# Patient Record
Sex: Female | Born: 1970 | Race: Black or African American | Hispanic: No | Marital: Single | Smoking: Current every day smoker
Health system: Southern US, Community
[De-identification: ages and names within clinical notes are randomized; demographics above are authoritative.]

## PROBLEM LIST (undated history)

## (undated) HISTORY — PX: UTERINE FIBROID SURGERY: SHX826

## (undated) HISTORY — PX: ABDOMINAL HYSTERECTOMY: SHX81

---

## 2005-08-14 ENCOUNTER — Emergency Department (HOSPITAL_COMMUNITY): Admission: EM | Admit: 2005-08-14 | Discharge: 2005-08-14 | Payer: Self-pay | Admitting: Emergency Medicine

## 2009-08-16 ENCOUNTER — Emergency Department (HOSPITAL_BASED_OUTPATIENT_CLINIC_OR_DEPARTMENT_OTHER): Admission: EM | Admit: 2009-08-16 | Discharge: 2009-08-16 | Payer: Self-pay | Admitting: Emergency Medicine

## 2009-08-18 ENCOUNTER — Ambulatory Visit: Payer: Self-pay | Admitting: Radiology

## 2009-08-18 ENCOUNTER — Ambulatory Visit (HOSPITAL_BASED_OUTPATIENT_CLINIC_OR_DEPARTMENT_OTHER): Admission: RE | Admit: 2009-08-18 | Discharge: 2009-08-18 | Payer: Self-pay | Admitting: Emergency Medicine

## 2011-01-06 LAB — PREGNANCY, URINE: Preg Test, Ur: NEGATIVE

## 2011-01-06 LAB — COMPREHENSIVE METABOLIC PANEL
ALT: 17 U/L (ref 0–35)
AST: 39 U/L — ABNORMAL HIGH (ref 0–37)
Albumin: 4 g/dL (ref 3.5–5.2)
Alkaline Phosphatase: 85 U/L (ref 39–117)
GFR calc Af Amer: >60 mL/min (ref >60)
Potassium: 4.9 mEq/L (ref 3.5–5.1)
Sodium: 137 mEq/L (ref 135–145)
Total Protein: 7.8 g/dL (ref 6.0–8.3)

## 2011-01-06 LAB — URINE MICROSCOPIC-ADD ON

## 2011-01-06 LAB — CBC
Hemoglobin: 12.1 g/dL (ref 12.0–15.0)
Platelets: 200 10*3/uL (ref 150–400)
RDW: 16.4 % — ABNORMAL HIGH (ref 11.5–15.5)

## 2011-01-06 LAB — DIFFERENTIAL
Basophils Relative: 2 % — ABNORMAL HIGH (ref 0–1)
Eosinophils Absolute: 0.3 10*3/uL (ref 0.0–0.7)
Lymphs Abs: 2.3 10*3/uL (ref 0.7–4.0)
Monocytes Absolute: 0.5 10*3/uL (ref 0.1–1.0)
Monocytes Relative: 5 % (ref 3–12)

## 2011-01-06 LAB — URINALYSIS, ROUTINE W REFLEX MICROSCOPIC
Glucose, UA: NEGATIVE mg/dL
Hgb urine dipstick: NEGATIVE
Protein, ur: NEGATIVE mg/dL

## 2013-09-13 ENCOUNTER — Emergency Department (HOSPITAL_BASED_OUTPATIENT_CLINIC_OR_DEPARTMENT_OTHER)
Admission: EM | Admit: 2013-09-13 | Discharge: 2013-09-13 | Disposition: A | Discharge status: Home / Self Care | Payer: Self-pay | Attending: Emergency Medicine | Admitting: Emergency Medicine

## 2013-09-13 ENCOUNTER — Encounter (HOSPITAL_BASED_OUTPATIENT_CLINIC_OR_DEPARTMENT_OTHER): Payer: Self-pay | Admitting: Emergency Medicine

## 2013-09-13 DIAGNOSIS — R1110 Vomiting, unspecified: Secondary | ICD-10-CM | POA: Insufficient documentation

## 2013-09-13 DIAGNOSIS — R109 Unspecified abdominal pain: Secondary | ICD-10-CM

## 2013-09-13 DIAGNOSIS — R1033 Periumbilical pain: Secondary | ICD-10-CM | POA: Insufficient documentation

## 2013-09-13 DIAGNOSIS — F17200 Nicotine dependence, unspecified, uncomplicated: Secondary | ICD-10-CM | POA: Insufficient documentation

## 2013-09-13 DIAGNOSIS — Z90710 Acquired absence of both cervix and uterus: Secondary | ICD-10-CM | POA: Insufficient documentation

## 2013-09-13 LAB — CBC WITH DIFFERENTIAL/PLATELET
Basophils Absolute: 0 10*3/uL (ref 0.0–0.1)
Basophils Relative: 0 % (ref 0–1)
Eosinophils Absolute: 0.1 10*3/uL (ref 0.0–0.7)
Hemoglobin: 13.7 g/dL (ref 12.0–15.0)
MCH: 29.7 pg (ref 26.0–34.0)
MCHC: 32 g/dL (ref 30.0–36.0)
Monocytes Relative: 3 % (ref 3–12)
Neutro Abs: 10.8 10*3/uL — ABNORMAL HIGH (ref 1.7–7.7)
Neutrophils Relative %: 86 % — ABNORMAL HIGH (ref 43–77)
Platelets: 180 10*3/uL (ref 150–400)

## 2013-09-13 LAB — URINE MICROSCOPIC-ADD ON

## 2013-09-13 LAB — COMPREHENSIVE METABOLIC PANEL
ALT: 10 U/L (ref 0–35)
AST: 14 U/L (ref 0–37)
Albumin: 3.7 g/dL (ref 3.5–5.2)
Alkaline Phosphatase: 86 U/L (ref 39–117)
BUN: 15 mg/dL (ref 6–23)
Chloride: 102 mEq/L (ref 96–112)
Potassium: 3.9 mEq/L (ref 3.5–5.1)
Sodium: 138 mEq/L (ref 135–145)
Total Bilirubin: 0.3 mg/dL (ref 0.3–1.2)
Total Protein: 7.5 g/dL (ref 6.0–8.3)

## 2013-09-13 LAB — URINALYSIS, ROUTINE W REFLEX MICROSCOPIC
Glucose, UA: NEGATIVE mg/dL
Ketones, ur: NEGATIVE mg/dL
Protein, ur: NEGATIVE mg/dL
pH: 5.5 (ref 5.0–8.0)

## 2013-09-13 MED ORDER — TRAMADOL HCL 50 MG PO TABS: 50.0000 mg | ORAL_TABLET | Freq: Four times a day (QID) | ORAL | Status: DC | PRN

## 2013-09-13 MED ORDER — MORPHINE SULFATE 4 MG/ML IJ SOLN
4.0000 mg | Freq: Once | INTRAMUSCULAR | Status: AC
  Administered 2013-09-13: 4 mg via INTRAVENOUS
  Filled 2013-09-13: qty 1

## 2013-09-13 MED ORDER — SODIUM CHLORIDE 0.9 % IV SOLN
INTRAVENOUS | Status: DC
  Administered 2013-09-13: 23:00:00 via INTRAVENOUS

## 2013-09-13 MED ORDER — HYDROCODONE-ACETAMINOPHEN 5-325 MG PO TABS
2.0000 | ORAL_TABLET | Freq: Once | ORAL | Status: AC
  Administered 2013-09-13: 2 via ORAL
  Filled 2013-09-13: qty 2

## 2013-09-13 MED ORDER — PROMETHAZINE HCL 25 MG RE SUPP: 25.0000 mg | Freq: Four times a day (QID) | RECTAL | Status: DC | PRN

## 2013-09-13 MED ORDER — ONDANSETRON HCL 4 MG/2ML IJ SOLN
4.0000 mg | Freq: Once | INTRAMUSCULAR | Status: AC
  Administered 2013-09-13: 4 mg via INTRAVENOUS
  Filled 2013-09-13: qty 2

## 2013-09-13 NOTE — ED Provider Notes (Signed)
CSN: [id number]     Arrival date & time 09/13/13  2133 History  This chart was scribed for Gilda Crease, * by Carl Best, ED Scribe. This patient was seen in room MH05/MH05 and the patient's care was started at 9:48 PM.     Chief Complaint  Patient presents with  . Abdominal Pain    Patient is a 42 y.o. female presenting with abdominal pain. The history is provided by the patient. No language interpreter was used.  Abdominal Pain Associated symptoms: vomiting   Associated symptoms: no diarrhea and no nausea    HPI Comments: Jocelyn Adams is a 42 y.o. female who presents to the Emergency Department complaining of intermittent periumbilical abdominal pain that started at 7 PM today.  The patient states that the pain will come in 5 second episodes.  She lists emesis as an associated symptom.  She denies nausea and diarrhea as associated symptoms.  She denies sick contacts.  The patient states that she has a history of hysterectomy.  She denies any abdominal surgeries.    History reviewed. No pertinent past medical history. Past Surgical History  Procedure Laterality Date  . Uterine fibroid surgery    . Abdominal hysterectomy     No family history on file. History  Substance Use Topics  . Smoking status: Current Every Day Smoker -- 0.20 packs/day  . Smokeless tobacco: Not on file  . Alcohol Use: No   OB History   Grav Para Term Preterm Abortions TAB SAB Ect Mult Living                 Review of Systems  Gastrointestinal: Positive for vomiting and abdominal pain. Negative for nausea and diarrhea.  All other systems reviewed and are negative.    Allergies  Review of patient's allergies indicates no known allergies.  Home Medications  No current outpatient prescriptions on file.  Triage Vitals: BP 114/72  Pulse 98  Temp(Src) 98.5 F (36.9 C) (Oral)  Resp 18  Ht 5\' 1"  (1.549 m)  Wt 165 lb (74.844 kg)  BMI 31.19 kg/m2  SpO2 98%  Physical Exam   Constitutional: She is oriented to person, place, and time. She appears well-developed and well-nourished. No distress.  HENT:  Head: Normocephalic and atraumatic.  Right Ear: Hearing normal.  Left Ear: Hearing normal.  Nose: Nose normal.  Mouth/Throat: Oropharynx is clear and moist and mucous membranes are normal.  Eyes: Conjunctivae and EOM are normal. Pupils are equal, round, and reactive to light.  Neck: Normal range of motion. Neck supple.  Cardiovascular: Regular rhythm, S1 normal and S2 normal.  Exam reveals no gallop and no friction rub.   No murmur heard. Pulmonary/Chest: Effort normal and breath sounds normal. No respiratory distress. She exhibits no tenderness.  Abdominal: Soft. Normal appearance and bowel sounds are normal. There is no hepatosplenomegaly. There is no tenderness. There is no rebound, no guarding, no tenderness at McBurney's point and negative Murphy's sign. No hernia.  Musculoskeletal: Normal range of motion.  Neurological: She is alert and oriented to person, place, and time. She has normal strength. No cranial nerve deficit or sensory deficit. Coordination normal. GCS eye subscore is 4. GCS verbal subscore is 5. GCS motor subscore is 6.  Skin: Skin is warm, dry and intact. No rash noted. No cyanosis.  Psychiatric: She has a normal mood and affect. Her speech is normal and behavior is normal. Thought content normal.    ED Course  Procedures (including critical  care time)  DIAGNOSTIC STUDIES: Oxygen Saturation is 98% on room air, normal by my interpretation.    COORDINATION OF CARE: 9:49 PM- Discussed obtaining blood work and a UA in the ED with the patient and the patient agreed to the treatment plan.    Labs Review Labs Reviewed  URINALYSIS, ROUTINE W REFLEX MICROSCOPIC   Imaging Review No results found.  EKG Interpretation   None       MDM  Diagnosis: Abdominal pain  Patient presents to the ER for evaluation of abdominal pain. She is  having episodic sharp cramping pains that last for seconds at a time. Exam was benign. She has some mild diffuse right-sided tenderness, but no Murphy sign, no there is point tenderness. There was normal except for slightly elevated white blood cell count, unclear etiology and significance. Patient given small dose of morphine and IV fluids, now pain-free. Repeat exam reveals no tenderness. She has benign abdominal exam and therefore will be discharged with symptomatic treatment. Patient was told to return in 12 hours if not improved return at any time if the pain worsens, especially right upper quadrant, right lower quadrant pain or fever.  I personally performed the services described in this documentation, which was scribed in my presence. The recorded information has been reviewed and is accurate.    Gilda Crease, MD 09/13/13 [phone]

## 2013-09-13 NOTE — ED Notes (Signed)
Periumbilical pain that started at 1700 today. Vomited x2.

## 2013-09-14 ENCOUNTER — Telehealth (HOSPITAL_BASED_OUTPATIENT_CLINIC_OR_DEPARTMENT_OTHER): Payer: Self-pay | Admitting: *Deleted

## 2013-09-15 LAB — URINE CULTURE

## 2013-10-15 ENCOUNTER — Emergency Department (HOSPITAL_BASED_OUTPATIENT_CLINIC_OR_DEPARTMENT_OTHER)
Admission: EM | Admit: 2013-10-15 | Discharge: 2013-10-16 | Disposition: A | Discharge status: Home / Self Care | Payer: Self-pay | Attending: Emergency Medicine | Admitting: Emergency Medicine

## 2013-10-15 ENCOUNTER — Encounter (HOSPITAL_BASED_OUTPATIENT_CLINIC_OR_DEPARTMENT_OTHER): Payer: Self-pay | Admitting: Emergency Medicine

## 2013-10-15 DIAGNOSIS — J069 Acute upper respiratory infection, unspecified: Secondary | ICD-10-CM | POA: Insufficient documentation

## 2013-10-15 DIAGNOSIS — F17200 Nicotine dependence, unspecified, uncomplicated: Secondary | ICD-10-CM | POA: Insufficient documentation

## 2013-10-15 DIAGNOSIS — J029 Acute pharyngitis, unspecified: Secondary | ICD-10-CM | POA: Insufficient documentation

## 2013-10-15 LAB — RAPID STREP SCREEN (MED CTR MEBANE ONLY): Streptococcus, Group A Screen (Direct): NEGATIVE

## 2013-10-15 MED ORDER — LIDOCAINE VISCOUS 2 % MT SOLN
OROMUCOSAL | Status: AC
  Filled 2013-10-15: qty 15

## 2013-10-15 MED ORDER — HYDROCODONE-HOMATROPINE 5-1.5 MG/5ML PO SYRP: 5.0000 mL | ORAL_SOLUTION | Freq: Four times a day (QID) | ORAL | Status: DC | PRN

## 2013-10-15 MED ORDER — LIDOCAINE VISCOUS 2 % MT SOLN
15.0000 mL | Freq: Once | OROMUCOSAL | Status: AC
  Administered 2013-10-15: 15 mL via OROMUCOSAL

## 2013-10-15 NOTE — ED Notes (Signed)
Pt states cough that started 2 weeks ago, unrelieved by OTC medication.  States she got some "cough syrup with pain medication" prescription med from a friend, which did help relieve symptoms, but then sore throat started one week ago, temporary relief received from cepacol tablets.  Denies other symptoms.

## 2013-10-15 NOTE — ED Provider Notes (Signed)
CSN: [id number]     Arrival date & time 10/15/13  2215 History  This chart was scribed for Rodd Heft Smitty CordsK Zyasia Halbleib-Rasch, MD by Danella Maiersaroline Early, ED Scribe. This patient was seen in room MH04/MH04 and the patient's care was started at 11:02 PM.    Chief Complaint  Patient presents with  . URI   Patient is a 43 y.o. female presenting with URI. The history is provided by the patient. No language interpreter was used.  URI Presenting symptoms: cough and sore throat   Cough:    Cough characteristics:  Non-productive   Severity:  Mild   Onset quality:  Gradual   Timing:  Intermittent   Progression:  Unchanged   Chronicity:  New Sore throat:    Severity:  Moderate   Onset quality:  Gradual   Timing:  Constant   Progression:  Unchanged Severity:  Moderate Onset quality:  Gradual Timing:  Constant Progression:  Unchanged Chronicity:  New Relieved by:  Nothing Worsened by:  Nothing tried Ineffective treatments:  OTC medications Associated symptoms: no arthralgias, no neck pain and no swollen glands   Risk factors: not elderly    HPI Comments: Jocelyn Adams is a 43 y.o. female who presents to the Emergency Department complaining of productive cough onset 1 week ago. She also reports sore throat and pain with swallowing. She reports seeing white spots in her throat. She has been taking NyQuil, Cepacol, and Robitussin with no relief.   History reviewed. No pertinent past medical history. Past Surgical History  Procedure Laterality Date  . Uterine fibroid surgery    . Abdominal hysterectomy     No family history on file. History  Substance Use Topics  . Smoking status: Current Every Day Smoker -- 0.20 packs/day  . Smokeless tobacco: Not on file  . Alcohol Use: No   OB History   Grav Para Term Preterm Abortions TAB SAB Ect Mult Living                 Review of Systems  HENT: Positive for sore throat.   Respiratory: Positive for cough.   Musculoskeletal: Negative for arthralgias and  neck pain.  All other systems reviewed and are negative.    Allergies  Review of patient's allergies indicates no known allergies.  Home Medications   Current Outpatient Rx  Name  Route  Sig  Dispense  Refill  . promethazine (PHENERGAN) 25 MG suppository   Rectal   Place 1 suppository (25 mg total) rectally every 6 (six) hours as needed for nausea or vomiting.   12 each   0   . traMADol (ULTRAM) 50 MG tablet   Oral   Take 1 tablet (50 mg total) by mouth every 6 (six) hours as needed.   15 tablet   0    BP 122/82  Pulse 85  Temp(Src) 98.8 F (37.1 C) (Oral)  Resp 20  Ht 5\' 1"  (1.549 m)  Wt 165 lb (74.844 kg)  BMI 31.19 kg/m2  SpO2 99% Physical Exam  Nursing note and vitals reviewed. Constitutional: She is oriented to person, place, and time. She appears well-developed and well-nourished. No distress.  HENT:  Head: Normocephalic and atraumatic.  Mouth/Throat: Oropharynx is clear and moist. No oropharyngeal exudate.   no swelling of the tongue or lips, no patches or plaques  Eyes: EOM are normal. Pupils are equal, round, and reactive to light.  Neck: Normal range of motion. Neck supple. No tracheal deviation present.  No pain with  displacement of the trachea  Cardiovascular: Normal rate and regular rhythm.   Pulmonary/Chest: Effort normal. No stridor. No respiratory distress. She has no wheezes.  Abdominal: Soft. Bowel sounds are normal. There is no tenderness. There is no rebound and no guarding.  Musculoskeletal: Normal range of motion.  Neurological: She is alert and oriented to person, place, and time. She displays normal reflexes.  Skin: Skin is warm and dry.  Psychiatric: She has a normal mood and affect. Her behavior is normal.    ED Course  Procedures (including critical care time) Medications - No data to display  DIAGNOSTIC STUDIES: Oxygen Saturation is 99% on RA, normal by my interpretation.    COORDINATION OF CARE: 11:07 PM- Discussed treatment  plan with pt which includes rapid strep test. Pt agrees to plan.    Labs Review Labs Reviewed  RAPID STREP SCREEN   Imaging Review No results found.  EKG Interpretation   None       MDM  No diagnosis found. Hycodan as patient requested.  Based on Centor criteria no indication to treat.  Return for worsening symptoms  I personally performed the services described in this documentation, which was scribed in my presence. The recorded information has been reviewed and is accurate.   Jasmine Awe, MD 10/16/13 [phone]

## 2013-10-15 NOTE — ED Notes (Signed)
C/o prod cough, sore throat x 1 week

## 2013-10-17 LAB — CULTURE, GROUP A STREP

## 2013-10-20 ENCOUNTER — Emergency Department (HOSPITAL_BASED_OUTPATIENT_CLINIC_OR_DEPARTMENT_OTHER)
Admission: EM | Admit: 2013-10-20 | Discharge: 2013-10-20 | Discharge status: Against Medical Advice | Payer: Self-pay | Attending: Emergency Medicine | Admitting: Emergency Medicine

## 2013-10-20 DIAGNOSIS — R059 Cough, unspecified: Secondary | ICD-10-CM | POA: Insufficient documentation

## 2013-10-20 DIAGNOSIS — J029 Acute pharyngitis, unspecified: Secondary | ICD-10-CM | POA: Insufficient documentation

## 2013-10-20 DIAGNOSIS — R05 Cough: Secondary | ICD-10-CM | POA: Insufficient documentation

## 2013-10-20 NOTE — ED Notes (Signed)
Pt. Reports she was seen this past week at [facility] and received cough med.  Pt. Reports she needing more or something stronger for her sore throat and cough.

## 2013-12-25 ENCOUNTER — Emergency Department (HOSPITAL_BASED_OUTPATIENT_CLINIC_OR_DEPARTMENT_OTHER)
Admission: EM | Admit: 2013-12-25 | Discharge: 2013-12-25 | Disposition: A | Discharge status: Home / Self Care | Payer: BC Managed Care – PPO | Attending: Emergency Medicine | Admitting: Emergency Medicine

## 2013-12-25 ENCOUNTER — Encounter (HOSPITAL_BASED_OUTPATIENT_CLINIC_OR_DEPARTMENT_OTHER): Payer: Self-pay | Admitting: Emergency Medicine

## 2013-12-25 DIAGNOSIS — F411 Generalized anxiety disorder: Secondary | ICD-10-CM | POA: Insufficient documentation

## 2013-12-25 DIAGNOSIS — A5903 Trichomonal cystitis and urethritis: Secondary | ICD-10-CM

## 2013-12-25 DIAGNOSIS — F17200 Nicotine dependence, unspecified, uncomplicated: Secondary | ICD-10-CM | POA: Insufficient documentation

## 2013-12-25 DIAGNOSIS — R143 Flatulence: Secondary | ICD-10-CM

## 2013-12-25 DIAGNOSIS — Z90710 Acquired absence of both cervix and uterus: Secondary | ICD-10-CM | POA: Insufficient documentation

## 2013-12-25 DIAGNOSIS — R141 Gas pain: Secondary | ICD-10-CM | POA: Insufficient documentation

## 2013-12-25 DIAGNOSIS — R142 Eructation: Secondary | ICD-10-CM | POA: Insufficient documentation

## 2013-12-25 DIAGNOSIS — A5909 Other urogenital trichomoniasis: Secondary | ICD-10-CM | POA: Insufficient documentation

## 2013-12-25 LAB — URINALYSIS, ROUTINE W REFLEX MICROSCOPIC
Bilirubin Urine: NEGATIVE
Glucose, UA: NEGATIVE mg/dL
Ketones, ur: NEGATIVE mg/dL
Nitrite: NEGATIVE
PROTEIN: NEGATIVE mg/dL
Specific Gravity, Urine: 1.024 (ref 1.005–1.030)
UROBILINOGEN UA: 0.2 mg/dL (ref 0.0–1.0)
pH: 5.5 (ref 5.0–8.0)

## 2013-12-25 LAB — URINE MICROSCOPIC-ADD ON

## 2013-12-25 LAB — RAPID URINE DRUG SCREEN, HOSP PERFORMED
Amphetamines: NOT DETECTED
Barbiturates: NOT DETECTED
Benzodiazepines: NOT DETECTED
Cocaine: NOT DETECTED
OPIATES: NOT DETECTED
TETRAHYDROCANNABINOL: NOT DETECTED

## 2013-12-25 MED ORDER — GI COCKTAIL ~~LOC~~
30.0000 mL | Freq: Once | ORAL | Status: AC
Start: 2013-12-25 — End: 2013-12-25
  Administered 2013-12-25: 30 mL via ORAL
  Filled 2013-12-25: qty 30

## 2013-12-25 MED ORDER — AZITHROMYCIN 1 G PO PACK
1.0000 g | PACK | Freq: Once | ORAL | Status: AC
  Administered 2013-12-25: 1 g via ORAL
  Filled 2013-12-25: qty 1

## 2013-12-25 MED ORDER — PHENAZOPYRIDINE HCL 200 MG PO TABS: 200.0000 mg | ORAL_TABLET | Freq: Three times a day (TID) | ORAL | Status: DC

## 2013-12-25 MED ORDER — CEFTRIAXONE SODIUM 250 MG IJ SOLR
250.0000 mg | Freq: Once | INTRAMUSCULAR | Status: AC
  Administered 2013-12-25: 250 mg via INTRAMUSCULAR
  Filled 2013-12-25: qty 250

## 2013-12-25 MED ORDER — KETOROLAC TROMETHAMINE 60 MG/2ML IM SOLN
60.0000 mg | Freq: Once | INTRAMUSCULAR | Status: AC
  Administered 2013-12-25: 60 mg via INTRAMUSCULAR
  Filled 2013-12-25: qty 2

## 2013-12-25 MED ORDER — DICYCLOMINE HCL 10 MG/ML IM SOLN
20.0000 mg | Freq: Once | INTRAMUSCULAR | Status: AC
  Administered 2013-12-25: 20 mg via INTRAMUSCULAR
  Filled 2013-12-25: qty 2

## 2013-12-25 MED ORDER — PHENAZOPYRIDINE HCL 100 MG PO TABS
200.0000 mg | ORAL_TABLET | Freq: Once | ORAL | Status: AC
  Administered 2013-12-25: 200 mg via ORAL
  Filled 2013-12-25: qty 2

## 2013-12-25 MED ORDER — METRONIDAZOLE 500 MG PO TABS
2000.0000 mg | ORAL_TABLET | Freq: Once | ORAL | Status: AC
  Administered 2013-12-25: 2000 mg via ORAL
  Filled 2013-12-25: qty 4

## 2013-12-25 NOTE — Discharge Instructions (Signed)
Trichomoniasis °Trichomoniasis is an infection, caused by the Trichomonas organism, that affects both women and men. In women, the outer female genitalia and the vagina are affected. In men, the penis is mainly affected, but the prostate and other reproductive organs can also be involved. Trichomoniasis is a sexually transmitted disease (STD) and is most often passed to another person through sexual contact. The majority of people who get trichomoniasis do so from a sexual encounter and are also at risk for other STDs. °CAUSES  °· Sexual intercourse with an infected partner. °· It can be present in swimming pools or hot tubs. °SYMPTOMS  °· Abnormal gray-green frothy vaginal discharge in women. °· Vaginal itching and irritation in women. °· Itching and irritation of the area outside the vagina in women. °· Penile discharge with or without pain in males. °· Inflammation of the urethra (urethritis), causing painful urination. °· Bleeding after sexual intercourse. °RELATED COMPLICATIONS °· Pelvic inflammatory disease. °· Infection of the uterus (endometritis). °· Infertility. °· Tubal (ectopic) pregnancy. °· It can be associated with other STDs, including gonorrhea and chlamydia, hepatitis B, and HIV. °COMPLICATIONS DURING PREGNANCY °· Early (premature) delivery. °· Premature rupture of the membranes (PROM). °· Low birth weight. °DIAGNOSIS  °· Visualization of Trichomonas under the microscope from the vagina discharge. °· Ph of the vagina greater than 4.5, tested with a test tape. °· Trich Rapid Test. °· Culture of the organism, but this is not usually needed. °· It may be found on a Pap test. °· Having a "strawberry cervix,"which means the cervix looks very red like a strawberry. °TREATMENT  °· You may be given medication to fight the infection. Inform your caregiver if you could be or are pregnant. Some medications used to treat the infection should not be taken during pregnancy. °· Over-the-counter medications or  creams to decrease itching or irritation may be recommended. °· Your sexual partner will need to be treated if infected. °HOME CARE INSTRUCTIONS  °· Take all medication prescribed by your caregiver. °· Take over-the-counter medication for itching or irritation as directed by your caregiver. °· Do not have sexual intercourse while you have the infection. °· Do not douche or wear tampons. °· Discuss your infection with your partner, as your partner may have acquired the infection from you. Or, your partner may have been the person who transmitted the infection to you. °· Have your sex partner examined and treated if necessary. °· Practice safe, informed, and protected sex. °· See your caregiver for other STD testing. °SEEK MEDICAL CARE IF:  °· You still have symptoms after you finish the medication. °· You have an oral temperature above 102° F (38.9° C). °· You develop belly (abdominal) pain. °· You have pain when you urinate. °· You have bleeding after sexual intercourse. °· You develop a rash. °· The medication makes you sick or makes you throw up (vomit). °Document Released: 03/16/2001 Document Revised: 12/13/2011 Document Reviewed: 04/11/2009 °[employer]® Patient Information ©2014 [employer]. ° °

## 2013-12-25 NOTE — ED Notes (Signed)
Pt states that she developed acute lower abdominal pain at 0100 awoke her from her sleep

## 2013-12-25 NOTE — ED Provider Notes (Signed)
CSN: [id number]     Arrival date & time 12/25/13  0135 History   First MD Initiated Contact with Patient 12/25/13 0209     Chief Complaint  Patient presents with  . Abdominal Pain     (Consider location/radiation/quality/duration/timing/severity/associated sxs/prior Treatment) Patient is a 44 y.o. female presenting with abdominal pain. The history is provided by the patient. History limited by: uncooperative.  Abdominal Pain Pain location:  Suprapubic Pain quality: cramping   Pain radiates to:  Does not radiate Pain severity:  Severe Onset quality:  Sudden Timing:  Constant Progression:  Unchanged Chronicity:  Recurrent Context: not awakening from sleep   Relieved by:  Nothing Worsened by:  Nothing tried Ineffective treatments:  None tried Associated symptoms: no anorexia, no constipation, no diarrhea and no vomiting   Risk factors: no alcohol abuse     History reviewed. No pertinent past medical history. Past Surgical History  Procedure Laterality Date  . Uterine fibroid surgery    . Abdominal hysterectomy     History reviewed. No pertinent family history. History  Substance Use Topics  . Smoking status: Current Every Day Smoker -- 0.20 packs/day  . Smokeless tobacco: Not on file  . Alcohol Use: No   OB History   Grav Para Term Preterm Abortions TAB SAB Ect Mult Living                 Review of Systems  Gastrointestinal: Positive for abdominal pain. Negative for vomiting, diarrhea, constipation and anorexia.  All other systems reviewed and are negative.      Allergies  Review of patient's allergies indicates no known allergies.  Home Medications   Current Outpatient Rx  Name  Route  Sig  Dispense  Refill  . HYDROcodone-homatropine (HYCODAN) 5-1.5 MG/5ML syrup   Oral   Take 5 mLs by mouth every 6 (six) hours as needed for cough.   60 mL   0   . promethazine (PHENERGAN) 25 MG suppository   Rectal   Place 1 suppository (25 mg total) rectally every 6  (six) hours as needed for nausea or vomiting.   12 each   0   . traMADol (ULTRAM) 50 MG tablet   Oral   Take 1 tablet (50 mg total) by mouth every 6 (six) hours as needed.   15 tablet   0    BP 120/64  Pulse 86  Temp(Src) 98.8 F (37.1 C) (Oral)  Resp 18  Ht 5\' 1"  (1.549 m)  Wt 170 lb (77.111 kg)  BMI 32.14 kg/m2  SpO2 100% Physical Exam  Constitutional: She is oriented to person, place, and time. She appears well-developed and well-nourished. No distress.  HENT:  Head: Normocephalic and atraumatic.  Mouth/Throat: Oropharynx is clear and moist.  Eyes: Conjunctivae are normal. Pupils are equal, round, and reactive to light.  Neck: Normal range of motion. Neck supple.  Cardiovascular: Normal rate, regular rhythm and intact distal pulses.   Pulmonary/Chest: Effort normal and breath sounds normal. She has no wheezes. She has no rales.  Abdominal: Soft. Bowel sounds are increased. There is no tenderness. There is no rebound and no guarding.  Gas throughout  Musculoskeletal: Normal range of motion.  Neurological: She is alert and oriented to person, place, and time.  Skin: Skin is warm and dry.  Psychiatric: Her mood appears anxious.    ED Course  Procedures (including critical care time) Labs Review Labs Reviewed  URINALYSIS, ROUTINE W REFLEX MICROSCOPIC - Abnormal; Notable for the following:  APPearance CLOUDY (*)    Hgb urine dipstick SMALL (*)    Leukocytes, UA SMALL (*)    All other components within normal limits  URINE MICROSCOPIC-ADD ON - Abnormal; Notable for the following:    Squamous Epithelial / LPF MANY (*)    Bacteria, UA MANY (*)    All other components within normal limits  URINE RAPID DRUG SCREEN (HOSP PERFORMED)   Imaging Review No results found.   EKG Interpretation None      MDM   Final diagnoses:  None    EDP was in the room each time with Raynelle FanningJulie patient's nurse  Trichomoniasis, will treat for same also suspect cramping from gas.   Have treated for same  Medications  gi cocktail (Maalox,Lidocaine,Donnatal) (not administered)  phenazopyridine (PYRIDIUM) tablet 200 mg (not administered)  dicyclomine (BENTYL) injection 20 mg (20 mg Intramuscular Given 12/25/13 0223)  ketorolac (TORADOL) injection 60 mg (60 mg Intramuscular Given 12/25/13 0224)  metroNIDAZOLE (FLAGYL) tablet 2,000 mg (2,000 mg Oral Given 12/25/13 0309)  cefTRIAXone (ROCEPHIN) injection 250 mg (250 mg Intramuscular Given 12/25/13 0311)  azithromycin (ZITHROMAX) powder 1 g (1 g Oral Given 12/25/13 0308)   Patient advised no sexual activity until 7 days after all partners treated and to follow up with her GYN or the county health department in 7 days for a recheck.      Jasmine AweApril K Jathen Sudano-Rasch, MD 12/25/13 [phone]

## 2015-03-02 ENCOUNTER — Encounter (HOSPITAL_COMMUNITY): Payer: Self-pay

## 2015-03-02 ENCOUNTER — Emergency Department (HOSPITAL_COMMUNITY)
Admission: EM | Admit: 2015-03-02 | Discharge: 2015-03-02 | Disposition: A | Discharge status: Home / Self Care | Payer: Self-pay | Attending: Emergency Medicine | Admitting: Emergency Medicine

## 2015-03-02 DIAGNOSIS — Z79899 Other long term (current) drug therapy: Secondary | ICD-10-CM | POA: Insufficient documentation

## 2015-03-02 DIAGNOSIS — Y9389 Activity, other specified: Secondary | ICD-10-CM | POA: Insufficient documentation

## 2015-03-02 DIAGNOSIS — Z720 Tobacco use: Secondary | ICD-10-CM | POA: Insufficient documentation

## 2015-03-02 DIAGNOSIS — Y998 Other external cause status: Secondary | ICD-10-CM | POA: Insufficient documentation

## 2015-03-02 DIAGNOSIS — Z23 Encounter for immunization: Secondary | ICD-10-CM | POA: Insufficient documentation

## 2015-03-02 DIAGNOSIS — W260XXA Contact with knife, initial encounter: Secondary | ICD-10-CM | POA: Insufficient documentation

## 2015-03-02 DIAGNOSIS — S61219A Laceration without foreign body of unspecified finger without damage to nail, initial encounter: Secondary | ICD-10-CM

## 2015-03-02 DIAGNOSIS — Y92009 Unspecified place in unspecified non-institutional (private) residence as the place of occurrence of the external cause: Secondary | ICD-10-CM | POA: Insufficient documentation

## 2015-03-02 DIAGNOSIS — S61211A Laceration without foreign body of left index finger without damage to nail, initial encounter: Secondary | ICD-10-CM | POA: Insufficient documentation

## 2015-03-02 MED ORDER — TETANUS-DIPHTH-ACELL PERTUSSIS 5-2.5-18.5 LF-MCG/0.5 IM SUSP
0.5000 mL | Freq: Once | INTRAMUSCULAR | Status: AC
  Administered 2015-03-02: 0.5 mL via INTRAMUSCULAR
  Filled 2015-03-02: qty 0.5

## 2015-03-02 NOTE — Discharge Instructions (Signed)
Your laceration could not be sutured closed because it is more than 12 hours old. To treat this laceration, soak the wound in warm water 3 times a day and cleaned well with soap. Rinse the wound well after cleaning, and apply a light gauze bandage. Continue this treatment until the wound heals. Complete wound healing will likely take 2 or 3 weeks. Do not use chemicals, or get the wound wet at work until it is completely healed. Return here if needed, for problems.   Laceration Care, Adult A laceration is a cut or lesion that goes through all layers of the skin and into the tissue just beneath the skin. TREATMENT  Some lacerations may not require closure. Some lacerations may not be able to be closed due to an increased risk of infection. It is important to see your caregiver as soon as possible after an injury to minimize the risk of infection and maximize the opportunity for successful closure. If closure is appropriate, pain medicines may be given, if needed. The wound will be cleaned to help prevent infection. Your caregiver will use stitches (sutures), staples, wound glue (adhesive), or skin adhesive strips to repair the laceration. These tools bring the skin edges together to allow for faster healing and a better cosmetic outcome. However, all wounds will heal with a scar. Once the wound has healed, scarring can be minimized by covering the wound with sunscreen during the day for 1 full year. HOME CARE INSTRUCTIONS  For sutures or staples:  Keep the wound clean and dry.  If you were given a bandage (dressing), you should change it at least once a day. Also, change the dressing if it becomes wet or dirty, or as directed by your caregiver.  Wash the wound with soap and water 2 times a day. Rinse the wound off with water to remove all soap. Pat the wound dry with a clean towel.  After cleaning, apply a thin layer of the antibiotic ointment as recommended by your caregiver. This will help  prevent infection and keep the dressing from sticking.  You may shower as usual after the first 24 hours. Do not soak the wound in water until the sutures are removed.  Only take over-the-counter or prescription medicines for pain, discomfort, or fever as directed by your caregiver.  Get your sutures or staples removed as directed by your caregiver. For skin adhesive strips:  Keep the wound clean and dry.  Do not get the skin adhesive strips wet. You may bathe carefully, using caution to keep the wound dry.  If the wound gets wet, pat it dry with a clean towel.  Skin adhesive strips will fall off on their own. You may trim the strips as the wound heals. Do not remove skin adhesive strips that are still stuck to the wound. They will fall off in time. For wound adhesive:  You may briefly wet your wound in the shower or bath. Do not soak or scrub the wound. Do not swim. Avoid periods of heavy perspiration until the skin adhesive has fallen off on its own. After showering or bathing, gently pat the wound dry with a clean towel.  Do not apply liquid medicine, cream medicine, or ointment medicine to your wound while the skin adhesive is in place. This may loosen the film before your wound is healed.  If a dressing is placed over the wound, be careful not to apply tape directly over the skin adhesive. This may cause the adhesive to be  pulled off before the wound is healed.  Avoid prolonged exposure to sunlight or tanning lamps while the skin adhesive is in place. Exposure to ultraviolet light in the first year will darken the scar.  The skin adhesive will usually remain in place for 5 to 10 days, then naturally fall off the skin. Do not pick at the adhesive film. You may need a tetanus shot if:  You cannot remember when you had your last tetanus shot.  You have never had a tetanus shot. If you get a tetanus shot, your arm may swell, get red, and feel warm to the touch. This is common and not  a problem. If you need a tetanus shot and you choose not to have one, there is a rare chance of getting tetanus. Sickness from tetanus can be serious. SEEK MEDICAL CARE IF:   You have redness, swelling, or increasing pain in the wound.  You see a red line that goes away from the wound.  You have yellowish-white fluid (pus) coming from the wound.  You have a fever.  You notice a bad smell coming from the wound or dressing.  Your wound breaks open before or after sutures have been removed.  You notice something coming out of the wound such as wood or glass.  Your wound is on your hand or foot and you cannot move a finger or toe. SEEK IMMEDIATE MEDICAL CARE IF:   Your pain is not controlled with prescribed medicine.  You have severe swelling around the wound causing pain and numbness or a change in color in your arm, hand, leg, or foot.  Your wound splits open and starts bleeding.  You have worsening numbness, weakness, or loss of function of any joint around or beyond the wound.  You develop painful lumps near the wound or on the skin anywhere on your body. MAKE SURE YOU:   Understand these instructions.  Will watch your condition.  Will get help right away if you are not doing well or get worse. Document Released: 09/20/2005 Document Revised: 12/13/2011 Document Reviewed: 03/16/2011 [facility] Patient Information 2015 [place], [place]. This information is not intended to replace advice given to you by your health care provider. Make sure you discuss any questions you have with your health care provider.

## 2015-03-02 NOTE — ED Notes (Signed)
Patient states she was cutting food at home and cut her left pointer finger with a knife. Laceration slightly bleeding.

## 2015-03-02 NOTE — ED Provider Notes (Signed)
CSN: [id number]     Arrival date & time 03/02/15  0911 History   First MD Initiated Contact with Patient 03/02/15 [phone]     Chief Complaint  Patient presents with  . Laceration     (Consider location/radiation/quality/duration/timing/severity/associated sxs/prior Treatment) HPI   Jocelyn Adams is a 44 y.o. female who presents for evaluation of injury to left index finger yesterday, when she was cutting food. She is here because the wound is still bleeding. The wound is more than 12 hours. She denies bleeding diathesis. There are no other known modifying factors.   History reviewed. No pertinent past medical history. Past Surgical History  Procedure Laterality Date  . Uterine fibroid surgery    . Abdominal hysterectomy     History reviewed. No pertinent family history. History  Substance Use Topics  . Smoking status: Current Every Day Smoker -- 0.50 packs/day    Types: Cigarettes  . Smokeless tobacco: Never Used  . Alcohol Use: No   OB History    No data available     Review of Systems  All other systems reviewed and are negative.     Allergies  Review of patient's allergies indicates no known allergies.  Home Medications   Prior to Admission medications   Medication Sig Start Date End Date Taking? Authorizing Provider  HYDROcodone-homatropine (HYCODAN) 5-1.5 MG/5ML syrup Take 5 mLs by mouth every 6 (six) hours as needed for cough. 10/15/13   April Palumbo, MD  phenazopyridine (PYRIDIUM) 200 MG tablet Take 1 tablet (200 mg total) by mouth 3 (three) times daily. 12/25/13   April Palumbo, MD  promethazine (PHENERGAN) 25 MG suppository Place 1 suppository (25 mg total) rectally every 6 (six) hours as needed for nausea or vomiting. 09/13/13   Gilda Crease, MD  traMADol (ULTRAM) 50 MG tablet Take 1 tablet (50 mg total) by mouth every 6 (six) hours as needed. 09/13/13   Gilda Crease, MD   BP 112/73 mmHg  Temp(Src) 98.2 F (36.8 C) (Oral)  Resp 20  Ht   (1.549 m)  Wt 161 lb (73.029 kg)  BMI 30.44 kg/m2  SpO2 97% Physical Exam  Constitutional: She is oriented to person, place, and time. She appears well-developed and well-nourished.  HENT:  Head: Normocephalic and atraumatic.  Right Ear: External ear normal.  Left Ear: External ear normal.  Eyes: Conjunctivae and EOM are normal. Pupils are equal, round, and reactive to light.  Neck: Normal range of motion and phonation normal. Neck supple.  Cardiovascular: Normal rate.   Pulmonary/Chest: Effort normal. She exhibits no bony tenderness.  Abdominal: Soft. There is no tenderness.  Musculoskeletal: Normal range of motion.  Left index finger with laceration over dorsal PIP joint, bleeding somewhat. The laceration is curvilinear distal placed. The wound is bleeding somewhat. She has intact finger extension and distal circulation and sensation in the affected finger.  Neurological: She is alert and oriented to person, place, and time. No cranial nerve deficit or sensory deficit. She exhibits normal muscle tone. Coordination normal.  Skin: Skin is warm, dry and intact.  Psychiatric: She has a normal mood and affect. Her behavior is normal. Judgment and thought content normal.  Nursing note and vitals reviewed.   ED Course  Procedures (including critical care time)  Medications  Tdap (BOOSTRIX) injection 0.5 mL (not administered)    Patient Vitals for the past 24 hrs:  BP Temp Temp src Resp SpO2 Height Weight  03/02/15 0920 112/73 mmHg 98.2 F (36.8 C)  Oral 20 97 % 5\' 1"  (1.549 m) 161 lb (73.029 kg)    9:31 AM Reevaluation with update and discussion. After initial assessment and treatment, an updated evaluation reveals no further complaints. Findings discussed with patient. All questions answered.Jocelyn Adams    Findings discussed with the patient. The wound is not amenable to closure, since it is greater than 12 hours old. QUESTIONS answered.  Labs Review Labs Reviewed -  No data to display  Imaging Review No results found.   EKG Interpretation None      MDM   Final diagnoses:  Finger laceration, initial encounter    Subacute finger laceration. No evidence for injuries to deep structures.  Nursing Notes Reviewed/ Care Coordinated Applicable Imaging Reviewed Interpretation of Laboratory Data incorporated into ED treatment   Nursing Notes Reviewed/ Care Coordinated Applicable Imaging Reviewed Interpretation of Laboratory Data incorporated into ED treatment  The patient appears reasonably screened and/or stabilized for discharge and I doubt any other medical condition or other [facility] requiring further screening, evaluation, or treatment in the ED at this time prior to discharge.  Plan: Home Medications- OTC analgesia; Home Treatments- wound care at home. Work release for no use of left hand until wound heals; return here if the recommended treatment, does not improve the symptoms; Recommended follow up- PCP or here prn    Jocelyn BaleElliott Harvest Deist, MD 03/02/15 1101

## 2015-06-08 ENCOUNTER — Emergency Department (HOSPITAL_BASED_OUTPATIENT_CLINIC_OR_DEPARTMENT_OTHER): Payer: Self-pay

## 2015-06-08 ENCOUNTER — Encounter (HOSPITAL_BASED_OUTPATIENT_CLINIC_OR_DEPARTMENT_OTHER): Payer: Self-pay | Admitting: *Deleted

## 2015-06-08 ENCOUNTER — Emergency Department (HOSPITAL_BASED_OUTPATIENT_CLINIC_OR_DEPARTMENT_OTHER)
Admission: EM | Admit: 2015-06-08 | Discharge: 2015-06-08 | Disposition: A | Discharge status: Home / Self Care | Payer: Self-pay | Attending: Emergency Medicine | Admitting: Emergency Medicine

## 2015-06-08 DIAGNOSIS — R52 Pain, unspecified: Secondary | ICD-10-CM

## 2015-06-08 DIAGNOSIS — Z720 Tobacco use: Secondary | ICD-10-CM | POA: Insufficient documentation

## 2015-06-08 DIAGNOSIS — N7011 Chronic salpingitis: Secondary | ICD-10-CM | POA: Insufficient documentation

## 2015-06-08 DIAGNOSIS — R911 Solitary pulmonary nodule: Secondary | ICD-10-CM | POA: Insufficient documentation

## 2015-06-08 DIAGNOSIS — R112 Nausea with vomiting, unspecified: Secondary | ICD-10-CM | POA: Insufficient documentation

## 2015-06-08 DIAGNOSIS — R1011 Right upper quadrant pain: Secondary | ICD-10-CM | POA: Insufficient documentation

## 2015-06-08 LAB — URINALYSIS, ROUTINE W REFLEX MICROSCOPIC
BILIRUBIN URINE: NEGATIVE
Glucose, UA: NEGATIVE mg/dL
Hgb urine dipstick: NEGATIVE
KETONES UR: NEGATIVE mg/dL
Leukocytes, UA: NEGATIVE
NITRITE: NEGATIVE
Protein, ur: NEGATIVE mg/dL
Specific Gravity, Urine: 1.023 (ref 1.005–1.030)
Urobilinogen, UA: 0.2 mg/dL (ref 0.0–1.0)
pH: 6.5 (ref 5.0–8.0)

## 2015-06-08 LAB — CBC WITH DIFFERENTIAL/PLATELET
BASOS PCT: 0 % (ref 0–1)
Basophils Absolute: 0 10*3/uL (ref 0.0–0.1)
EOS ABS: 0.2 10*3/uL (ref 0.0–0.7)
Eosinophils Relative: 2 % (ref 0–5)
HCT: 41.4 % (ref 36.0–46.0)
Hemoglobin: 13.1 g/dL (ref 12.0–15.0)
Lymphocytes Relative: 29 % (ref 12–46)
Lymphs Abs: 2 10*3/uL (ref 0.7–4.0)
MCH: 29.2 pg (ref 26.0–34.0)
MCHC: 31.6 g/dL (ref 30.0–36.0)
MCV: 92.2 fL (ref 78.0–100.0)
MONO ABS: 0.4 10*3/uL (ref 0.1–1.0)
Monocytes Relative: 6 % (ref 3–12)
NEUTROS PCT: 63 % (ref 43–77)
Neutro Abs: 4.4 10*3/uL (ref 1.7–7.7)
Platelets: 186 10*3/uL (ref 150–400)
RBC: 4.49 MIL/uL (ref 3.87–5.11)
RDW: 13.1 % (ref 11.5–15.5)
WBC: 7 10*3/uL (ref 4.0–10.5)

## 2015-06-08 LAB — WET PREP, GENITAL
TRICH WET PREP: NONE SEEN
Yeast Wet Prep HPF POC: NONE SEEN

## 2015-06-08 LAB — COMPREHENSIVE METABOLIC PANEL
ALBUMIN: 3.3 g/dL — AB (ref 3.5–5.0)
ALT: 11 U/L — ABNORMAL LOW (ref 14–54)
ANION GAP: 7 (ref 5–15)
AST: 14 U/L — ABNORMAL LOW (ref 15–41)
Alkaline Phosphatase: 70 U/L (ref 38–126)
BUN: 15 mg/dL (ref 6–20)
CHLORIDE: 104 mmol/L (ref 101–111)
CO2: 29 mmol/L (ref 22–32)
Calcium: 9.1 mg/dL (ref 8.9–10.3)
Creatinine, Ser: 0.69 mg/dL (ref 0.44–1.00)
GFR calc Af Amer: >60 mL/min (ref >60)
GFR calc non Af Amer: >60 mL/min (ref >60)
Glucose, Bld: 101 mg/dL — ABNORMAL HIGH (ref 65–99)
Potassium: 4 mmol/L (ref 3.5–5.1)
SODIUM: 140 mmol/L (ref 135–145)
Total Bilirubin: 0.6 mg/dL (ref 0.3–1.2)
Total Protein: 6.8 g/dL (ref 6.5–8.1)

## 2015-06-08 LAB — LIPASE, BLOOD: Lipase: 81 U/L — ABNORMAL HIGH (ref 22–51)

## 2015-06-08 MED ORDER — IBUPROFEN 800 MG PO TABS: 800.0000 mg | ORAL_TABLET | Freq: Once | ORAL | Status: DC

## 2015-06-08 MED ORDER — IBUPROFEN 200 MG PO TABS: 400.0000 mg | ORAL_TABLET | Freq: Four times a day (QID) | ORAL | Status: DC | PRN

## 2015-06-08 MED ORDER — IOHEXOL 300 MG/ML  SOLN
25.0000 mL | Freq: Once | INTRAMUSCULAR | Status: AC | PRN
  Administered 2015-06-08: 25 mL via ORAL

## 2015-06-08 MED ORDER — IBUPROFEN 800 MG PO TABS
800.0000 mg | ORAL_TABLET | Freq: Once | ORAL | Status: AC
  Administered 2015-06-08: 800 mg via ORAL
  Filled 2015-06-08: qty 1

## 2015-06-08 MED ORDER — IOHEXOL 300 MG/ML  SOLN
100.0000 mL | Freq: Once | INTRAMUSCULAR | Status: AC | PRN
  Administered 2015-06-08: 100 mL via INTRAVENOUS

## 2015-06-08 MED ORDER — NICOTINE 21 MG/24HR TD PT24: 21.0000 mg | MEDICATED_PATCH | Freq: Every day | TRANSDERMAL | Status: DC

## 2015-06-08 MED ORDER — NICOTINE POLACRILEX 4 MG MT GUM: 4.0000 mg | CHEWING_GUM | OROMUCOSAL | Status: DC | PRN

## 2015-06-08 NOTE — ED Notes (Signed)
Friday began having abd pain, has had some nausea, states vomits after everytime of eating. Poor appetite

## 2015-06-08 NOTE — Discharge Instructions (Signed)
Pelvic Pain Female pelvic pain can be caused by many different things and start from a variety of places. Pelvic pain refers to pain that is located in the lower half of the abdomen and between your hips. The pain may occur over a short period of time (acute) or may be reoccurring (chronic). The cause of pelvic pain may be related to disorders affecting the female reproductive organs (gynecologic), but it may also be related to the bladder, kidney stones, an intestinal complication, or muscle or skeletal problems. Getting help right away for pelvic pain is important, especially if there has been severe, sharp, or a sudden onset of unusual pain. It is also important to get help right away because some types of pelvic pain can be life threatening.  CAUSES  Below are only some of the causes of pelvic pain. The causes of pelvic pain can be in one of several categories.   Gynecologic.  Pelvic inflammatory disease.  Sexually transmitted infection.  Ovarian cyst or a twisted ovarian ligament (ovarian torsion).  Uterine lining that grows outside the uterus (endometriosis).  Fibroids, cysts, or tumors.  Ovulation.  Pregnancy.  Pregnancy that occurs outside the uterus (ectopic pregnancy).  Miscarriage.  Labor.  Abruption of the placenta or ruptured uterus.  Infection.  Uterine infection (endometritis).  Bladder infection.  Diverticulitis.  Miscarriage related to a uterine infection (septic abortion).  Bladder.  Inflammation of the bladder (cystitis).  Kidney stone(s).  Gastrointestinal.  Constipation.  Diverticulitis.  Neurologic.  Trauma.  Feeling pelvic pain because of mental or emotional causes (psychosomatic).  Cancers of the bowel or pelvis. EVALUATION  Your caregiver will want to take a careful history of your concerns. This includes recent changes in your health, a careful gynecologic history of your periods (menses), and a sexual history. Obtaining your family  history and medical history is also important. Your caregiver may suggest a pelvic exam. A pelvic exam will help identify the location and severity of the pain. It also helps in the evaluation of which organ system may be involved. In order to identify the cause of the pelvic pain and be properly treated, your caregiver may order tests. These tests may include:   A pregnancy test.  Pelvic ultrasonography.  An X-ray exam of the abdomen.  A urinalysis or evaluation of vaginal discharge.  Blood tests. HOME CARE INSTRUCTIONS   Only take over-the-counter or prescription medicines for pain, discomfort, or fever as directed by your caregiver.   Rest as directed by your caregiver.   Eat a balanced diet.   Drink enough fluids to make your urine clear or pale yellow, or as directed.   Avoid sexual intercourse if it causes pain.   Apply warm or cold compresses to the lower abdomen depending on which one helps the pain.   Avoid stressful situations.   Keep a journal of your pelvic pain. Write down when it started, where the pain is located, and if there are things that seem to be associated with the pain, such as food or your menstrual cycle.  Follow up with your caregiver as directed.  SEEK MEDICAL CARE IF:  Your medicine does not help your pain.  You have abnormal vaginal discharge. SEEK IMMEDIATE MEDICAL CARE IF:   You have heavy bleeding from the vagina.   Your pelvic pain increases.   You feel light-headed or faint.   You have chills.   You have pain with urination or blood in your urine.   You have uncontrolled diarrhea   or vomiting.   You have a fever or persistent symptoms for more than 3 days.  You have a fever and your symptoms suddenly get worse.   You are being physically or sexually abused.  MAKE SURE YOU:  Understand these instructions.  Will watch your condition.  Will get help if you are not doing well or get worse. Document Released:  08/17/2004 Document Revised: 02/04/2014 Document Reviewed: 01/10/2012 Exit[employer] Patient Information 2015 [employer]. This information is not intended to replace advice given to you by your health care provider. Make sure you discuss any questions you have with your health care provider.  

## 2015-06-08 NOTE — ED Provider Notes (Signed)
CSN: [id number]     Arrival date & time 06/08/15  0845 History   First MD Initiated Contact with Patient 06/08/15 [phone]     Chief Complaint  Patient presents with  . Abdominal Pain    HPI  44 year old AAF presents with intermittent right lower quadrant pain and nausea for the last 5 days. She's had episodes like this before and had an ultrasound back in 2010 that did not show gallstones. She denies any changes in her bowel movements, radiation to her back or chest, dysuria, vaginal bleeding/dischage, rash, or fever. She had a hysterectomy years ago for fibroids.  History reviewed. No pertinent past medical history. Past Surgical History  Procedure Laterality Date  . Uterine fibroid surgery    . Abdominal hysterectomy     History reviewed. No pertinent family history. Social History  Substance Use Topics  . Smoking status: Current Every Day Smoker -- 0.50 packs/day    Types: Cigarettes  . Smokeless tobacco: Never Used  . Alcohol Use: No   OB History    No data available     Review of Systems  Constitutional: Negative for fever.  Respiratory: Negative for shortness of breath.   Cardiovascular: Negative for chest pain.  Gastrointestinal: Positive for nausea, vomiting and abdominal pain. Negative for diarrhea, constipation and blood in stool.  Genitourinary: Negative for dysuria, urgency, frequency, flank pain, genital sores and pelvic pain.  Skin: Negative for rash.  Neurological: Negative for dizziness and light-headedness.    Allergies  Review of patient's allergies indicates no known allergies.  Home Medications   Prior to Admission medications   Medication Sig Start Date End Date Taking? Authorizing Provider  HYDROcodone-homatropine (HYCODAN) 5-1.5 MG/5ML syrup Take 5 mLs by mouth every 6 (six) hours as needed for cough. Patient not taking: Reported on 03/02/2015 10/15/13   April Palumbo, MD  phenazopyridine (PYRIDIUM) 200 MG tablet Take 1 tablet (200 mg total) by mouth 3  (three) times daily. Patient not taking: Reported on 03/02/2015 12/25/13   April Palumbo, MD  promethazine (PHENERGAN) 25 MG suppository Place 1 suppository (25 mg total) rectally every 6 (six) hours as needed for nausea or vomiting. Patient not taking: Reported on 03/02/2015 09/13/13   Gilda Crease, MD  traMADol (ULTRAM) 50 MG tablet Take 1 tablet (50 mg total) by mouth every 6 (six) hours as needed. Patient not taking: Reported on 03/02/2015 09/13/13   Gilda Crease, MD   BP 106/70 mmHg  Pulse 78  Temp(Src) 98.3 F (36.8 C) (Oral)  Resp 18  Ht  (1.549 m)  Wt 160 lb (72.576 kg)  BMI 30.25 kg/m2  SpO2 99%   Physical Exam  Constitutional: She appears well-developed and well-nourished.  HENT:  Head: Normocephalic and atraumatic.  Eyes: Conjunctivae and EOM are normal.  Neck: Normal range of motion. Neck supple.  Cardiovascular: Normal rate and regular rhythm.   Pulmonary/Chest: Effort normal and breath sounds normal.  Abdominal: Soft. She exhibits no distension and no mass. There is tenderness. There is no rebound and no guarding.  RUQ tenderness to palpation Murphys sign negative  Genitourinary: Vagina normal and uterus normal. No vaginal discharge found.  Musculoskeletal: Normal range of motion.  Skin: Skin is warm and dry.  Psychiatric: She has a normal mood and affect. Her behavior is normal.    ED Course  Procedures (including critical care time)  Labs Review Labs Reviewed  WET PREP, GENITAL - Abnormal; Notable for the following:    Clue  Cells Wet Prep HPF POC MODERATE (*)    WBC, Wet Prep HPF POC FEW (*)    All other components within normal limits  URINALYSIS, ROUTINE W REFLEX MICROSCOPIC (NOT AT [facility]) - Abnormal; Notable for the following:    APPearance CLOUDY (*)    All other components within normal limits  COMPREHENSIVE METABOLIC PANEL - Abnormal; Notable for the following:    Glucose, Bld 101 (*)    Albumin 3.3 (*)    AST 14 (*)    ALT 11  (*)    All other components within normal limits  LIPASE, BLOOD - Abnormal; Notable for the following:    Lipase 81 (*)    All other components within normal limits  CBC WITH DIFFERENTIAL/PLATELET  HIV ANTIBODY (ROUTINE TESTING)  GC/CHLAMYDIA PROBE AMP ([place]) NOT AT [facility]    Imaging Review No results found. I have personally reviewed and evaluated these images and lab results as part of my medical decision-making.   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 Abdominal CT was obtained prior to pelvic ultrasound that showed incidental SPN that should be followed with CT in 1 year. She is interested in quitting smoking so I rxed her nicotine patch. She has phone number for [facility] for established care going forward. Ibuprofen for pain PRN.    Selina Cooley, MD 06/08/15 1610  Rolan Bucco, MD 06/08/15 [phone]

## 2015-06-09 LAB — HIV ANTIBODY (ROUTINE TESTING W REFLEX): HIV SCREEN 4TH GENERATION: NONREACTIVE

## 2015-06-10 LAB — GC/CHLAMYDIA PROBE AMP (~~LOC~~) NOT AT ARMC
Chlamydia: NEGATIVE
NEISSERIA GONORRHEA: NEGATIVE

## 2015-06-18 ENCOUNTER — Encounter (HOSPITAL_BASED_OUTPATIENT_CLINIC_OR_DEPARTMENT_OTHER): Payer: Self-pay

## 2015-06-18 ENCOUNTER — Emergency Department (HOSPITAL_BASED_OUTPATIENT_CLINIC_OR_DEPARTMENT_OTHER)
Admission: EM | Admit: 2015-06-18 | Discharge: 2015-06-18 | Disposition: A | Discharge status: Home / Self Care | Payer: Self-pay | Attending: Emergency Medicine | Admitting: Emergency Medicine

## 2015-06-18 DIAGNOSIS — M542 Cervicalgia: Secondary | ICD-10-CM | POA: Insufficient documentation

## 2015-06-18 DIAGNOSIS — IMO0002 Reserved for concepts with insufficient information to code with codable children: Secondary | ICD-10-CM

## 2015-06-18 DIAGNOSIS — M778 Other enthesopathies, not elsewhere classified: Secondary | ICD-10-CM | POA: Insufficient documentation

## 2015-06-18 DIAGNOSIS — Z720 Tobacco use: Secondary | ICD-10-CM | POA: Insufficient documentation

## 2015-06-18 DIAGNOSIS — Z79899 Other long term (current) drug therapy: Secondary | ICD-10-CM | POA: Insufficient documentation

## 2015-06-18 MED ORDER — NAPROXEN 500 MG PO TABS: 500.0000 mg | ORAL_TABLET | Freq: Two times a day (BID) | ORAL | Status: DC

## 2015-06-18 NOTE — ED Provider Notes (Signed)
CSN: [id number]     Arrival date & time 06/18/15  1127 History   First MD Initiated Contact with Patient 06/18/15 1203     Chief Complaint  Patient presents with  . Arm Pain     (Consider location/radiation/quality/duration/timing/severity/associated sxs/prior Treatment) HPI Comments: Patient presents with acute onset of right arm pain 3 days ago. Patient describes the pain is worse in her wrist and forearm. The pain then radiates to her upper arm and side of her neck making her arm 'feel tired'. 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 She does this constantly. She denies vision change or any neurologic symptoms of stroke. Pain is worse with movement and activity. She denies numbness or tingling in her hand. No treatments prior to arrival. Course is constant.  The history is provided by the patient.    History reviewed. No pertinent past medical history. Past Surgical History  Procedure Laterality Date  . Uterine fibroid surgery    . Abdominal hysterectomy     No family history on file. Social History  Substance Use Topics  . Smoking status: Current Every Day Smoker -- 0.50 packs/day    Types: Cigarettes  . Smokeless tobacco: Never Used  . Alcohol Use: No   OB History    No data available     Review of Systems  Constitutional: Positive for activity change.  Musculoskeletal: Positive for myalgias, arthralgias and neck pain. Negative for back pain and joint swelling.  Skin: Negative for wound.  Neurological: Positive for weakness. Negative for numbness.      Allergies  Review of patient's allergies indicates no known allergies.  Home Medications   Prior to Admission medications   Medication Sig Start Date End Date Taking? Authorizing Provider  ibuprofen (MOTRIN IB) 200 MG tablet Take 2 tablets (400 mg total) by mouth every 6 (six) hours as needed. 06/08/15   Selina Cooley, MD  nicotine (NICODERM  CQ) 21 mg/24hr patch Place 1 patch (21 mg total) onto the skin daily. 06/08/15   Selina Cooley, MD  nicotine polacrilex (NICORETTE) 4 MG gum Take 1 each (4 mg total) by mouth as needed for smoking cessation. 06/08/15   Selina Cooley, MD   BP 114/68 mmHg  Pulse 82  Temp(Src) 98 F (36.7 C) (Oral)  Resp 16  Wt 164 lb 4.8 oz (74.526 kg)  SpO2 100% Physical Exam  Constitutional: She appears well-developed and well-nourished.  HENT:  Head: Normocephalic and atraumatic.  Eyes: Pupils are equal, round, and reactive to light.  Neck: Normal range of motion. Neck supple.  Cardiovascular: Exam reveals no decreased pulses.   Musculoskeletal: She exhibits tenderness. She exhibits no edema.       Right shoulder: Normal.       Left shoulder: Normal.       Right elbow: Normal.      Right wrist: She exhibits tenderness. She exhibits normal range of motion, no bony tenderness, no swelling and no effusion.       Cervical back: Normal.       Right upper arm: Normal.       Right forearm: She exhibits tenderness. She exhibits no bony tenderness and no swelling.       Arms:      Right hand: Normal. Normal sensation noted. Normal strength noted.  Neurological: She is alert. No sensory deficit.  Motor, sensation, and vascular distal to the injury is fully intact.  Skin: Skin is warm and dry.  Psychiatric: She has a normal mood and affect.  Nursing note and vitals reviewed.   ED Course  Procedures (including critical care time) Labs Review Labs Reviewed - No data to display  Imaging Review No results found. I have personally reviewed and evaluated these images and lab results as part of my medical decision-making.   EKG Interpretation None      12:32 PM Patient seen and examined.    Vital signs reviewed and are as follows: BP 114/68 mmHg  Pulse 82  Temp(Src) 98 F (36.7 C) (Oral)  Resp 16  Wt 164 lb 4.8 oz (74.526 kg)  SpO2 100%   Plan: Splint, NSAIDs, sling, RICE. Patient given a work note  for light duty. Patient given orthopedic sports medicine referral if no improvement in one week.   MDM   Final diagnoses:  Tendonitis of elbow or forearm   Patient with exam suspicious for tendinitis of right forearm, due to overuse injury from her job. No neurologic issues either central or peripheral. Normal neurologic exam. Treatment as above. Do not suspect stroke or central cause.    Renne Crigler, PA-C 06/18/15 1319  Benjiman Core, MD 06/19/15 1540

## 2015-06-18 NOTE — Discharge Instructions (Signed)
Please read and follow all provided instructions.  Your diagnoses today include:  1. Tendonitis of elbow or forearm    Tests performed today include:  Vital signs. See below for your results today.   Medications prescribed:   Naproxen - anti-inflammatory pain medication  Do not exceed  naproxen every 12 hours, take with food  You have been prescribed an anti-inflammatory medication or NSAID. Take with food. Take smallest effective dose for the shortest duration needed for your pain. Stop taking if you experience stomach pain or vomiting.   Take any prescribed medications only as directed.  Home care instructions:   Follow any educational materials contained in this packet  Follow R.I.C.E. Protocol:  R - rest your injury   I  - use ice on injury without applying directly to skin  C - compress injury with bandage or splint  E - elevate the injury as much as possible  Follow-up instructions: Please follow-up with your primary care provider or the provided orthopedic physician (bone specialist) if you continue to have significant pain in 1 week. In this case you may have a more severe injury that requires further care.   Return instructions:   Please return if your fingers are numb or tingling, appear gray or blue, or you have severe pain (also elevate the arm and loosen splint or wrap if you were given one)  Please return to the Emergency Department if you experience worsening symptoms.   Please return if you have any other emergent concerns.  Additional Information:  Your vital signs today were: BP 114/68 mmHg   Pulse 82   Temp(Src) 98 F (36.7 C) (Oral)   Resp 16   Wt 164 lb 4.8 oz (74.526 kg)   SpO2 100% If your blood pressure (BP) was elevated above 135/85 this visit, please have this repeated by your doctor within one month. --------------

## 2015-06-18 NOTE — ED Notes (Signed)
C/o pain to entire right arm x 3 days-denies injury

## 2015-07-15 ENCOUNTER — Encounter (HOSPITAL_BASED_OUTPATIENT_CLINIC_OR_DEPARTMENT_OTHER): Payer: Self-pay | Admitting: Emergency Medicine

## 2015-07-15 ENCOUNTER — Emergency Department (HOSPITAL_BASED_OUTPATIENT_CLINIC_OR_DEPARTMENT_OTHER)
Admission: EM | Admit: 2015-07-15 | Discharge: 2015-07-15 | Disposition: A | Discharge status: Home / Self Care | Payer: Self-pay | Attending: Emergency Medicine | Admitting: Emergency Medicine

## 2015-07-15 DIAGNOSIS — Y998 Other external cause status: Secondary | ICD-10-CM | POA: Insufficient documentation

## 2015-07-15 DIAGNOSIS — Z791 Long term (current) use of non-steroidal anti-inflammatories (NSAID): Secondary | ICD-10-CM | POA: Insufficient documentation

## 2015-07-15 DIAGNOSIS — Y9389 Activity, other specified: Secondary | ICD-10-CM | POA: Insufficient documentation

## 2015-07-15 DIAGNOSIS — Y9289 Other specified places as the place of occurrence of the external cause: Secondary | ICD-10-CM | POA: Insufficient documentation

## 2015-07-15 DIAGNOSIS — X58XXXA Exposure to other specified factors, initial encounter: Secondary | ICD-10-CM | POA: Insufficient documentation

## 2015-07-15 DIAGNOSIS — Z720 Tobacco use: Secondary | ICD-10-CM | POA: Insufficient documentation

## 2015-07-15 DIAGNOSIS — S63501A Unspecified sprain of right wrist, initial encounter: Secondary | ICD-10-CM | POA: Insufficient documentation

## 2015-07-15 MED ORDER — IBUPROFEN 800 MG PO TABS
800.0000 mg | ORAL_TABLET | Freq: Once | ORAL | Status: AC
  Administered 2015-07-15: 800 mg via ORAL
  Filled 2015-07-15: qty 1

## 2015-07-15 MED ORDER — IBUPROFEN 600 MG PO TABS: 600.0000 mg | ORAL_TABLET | Freq: Four times a day (QID) | ORAL | Status: DC | PRN

## 2015-07-15 NOTE — Discharge Instructions (Signed)
Take motrin as needed for pain.   Wear splint for comfort.   See Dr. Pearletha Forge for follow up.   You may benefit from physical therapy.   Light duty for now.   Return to ER if you have worse pain or swelling.

## 2015-07-15 NOTE — ED Notes (Signed)
Pt seen 06/18/15 in this er, dx with inflammation of wrist, pt back today to request light duty note to continue to work

## 2015-07-15 NOTE — ED Provider Notes (Signed)
CSN: [id number]     Arrival date & time 07/15/15  1310 History   First MD Initiated Contact with Patient 07/15/15 1331     Chief Complaint  Patient presents with  . Follow-up     (Consider location/radiation/quality/duration/timing/severity/associated sxs/prior Treatment) The history is provided by the patient.  Jocelyn Adams is a 44 y.o. female  Here presenting with right wrist pain.  Was seen in the ER a month ago an x-ray done that was normal.  Was diagnosed with right wrist sprain and was given a splint and placed on light duty. 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 Noticed that R wrist swelled up yesterday. Denies fall or injury. Denies numbness or tingling. Requesting work note.    History reviewed. No pertinent past medical history. Past Surgical History  Procedure Laterality Date  . Uterine fibroid surgery    . Abdominal hysterectomy     History reviewed. No pertinent family history. Social History  Substance Use Topics  . Smoking status: Current Every Day Smoker -- 0.50 packs/day    Types: Cigarettes  . Smokeless tobacco: Never Used  . Alcohol Use: No   OB History    No data available     Review of Systems  Musculoskeletal:       R wrist pain   All other systems reviewed and are negative.     Allergies  Review of patient's allergies indicates no known allergies.  Home Medications   Prior to Admission medications   Medication Sig Start Date End Date Taking? Authorizing Provider  ibuprofen (ADVIL,MOTRIN) 600 MG tablet Take 1 tablet (600 mg total) by mouth every 6 (six) hours as needed. 07/15/15   Richardean Canal, MD  naproxen (NAPROSYN) 500 MG tablet Take 1 tablet (500 mg total) by mouth 2 (two) times daily. 06/18/15   Renne Crigler, PA-C  nicotine (NICODERM CQ) 21 mg/24hr patch Place 1 patch (21 mg total) onto the skin daily. 06/08/15   Selina Cooley, MD  nicotine polacrilex (NICORETTE) 4 MG gum Take 1 each (4 mg total) by mouth as needed for smoking cessation.  06/08/15   Selina Cooley, MD   BP 105/59 mmHg  Pulse 87  Resp 18  SpO2 100% Physical Exam  Constitutional: She appears well-developed and well-nourished.  HENT:  Head: Normocephalic.  Eyes: Pupils are equal, round, and reactive to light.  Neck: Normal range of motion.  Cardiovascular: Normal rate.   Pulmonary/Chest: Effort normal.  Musculoskeletal:  R wrist slightly swollen and tender. 2+ radial pulse, dec ROM likely from pain. Nl capillary refill, no obvious deformities   Neurological: She is alert.  Skin: Skin is warm and dry.  Psychiatric: She has a normal mood and affect. Her behavior is normal. Judgment and thought content normal.  Nursing note and vitals reviewed.   ED Course  Procedures (including critical care time) Labs Review Labs Reviewed - No data to display  Imaging Review No results found. I have personally reviewed and evaluated these images and lab results as part of my medical decision-making.   EKG Interpretation None      MDM   Final diagnoses:  Wrist sprain, right, initial encounter    Jocelyn Adams is a 44 y.o. female here with R wrist pain. No injuries. Likely recurrent sprain. Recommend motrin, still has splint with her. Recommend ortho follow up. Will give light duty until cleared by ortho.     Richardean Canal, MD 07/15/15 1400

## 2015-12-23 ENCOUNTER — Encounter (HOSPITAL_BASED_OUTPATIENT_CLINIC_OR_DEPARTMENT_OTHER): Payer: Self-pay | Admitting: Emergency Medicine

## 2015-12-23 ENCOUNTER — Emergency Department (HOSPITAL_BASED_OUTPATIENT_CLINIC_OR_DEPARTMENT_OTHER): Payer: Self-pay

## 2015-12-23 ENCOUNTER — Emergency Department (HOSPITAL_BASED_OUTPATIENT_CLINIC_OR_DEPARTMENT_OTHER)
Admission: EM | Admit: 2015-12-23 | Discharge: 2015-12-23 | Disposition: A | Discharge status: Home / Self Care | Payer: Self-pay | Attending: Emergency Medicine | Admitting: Emergency Medicine

## 2015-12-23 DIAGNOSIS — F17210 Nicotine dependence, cigarettes, uncomplicated: Secondary | ICD-10-CM | POA: Insufficient documentation

## 2015-12-23 DIAGNOSIS — Z791 Long term (current) use of non-steroidal anti-inflammatories (NSAID): Secondary | ICD-10-CM | POA: Insufficient documentation

## 2015-12-23 DIAGNOSIS — R5383 Other fatigue: Secondary | ICD-10-CM | POA: Insufficient documentation

## 2015-12-23 DIAGNOSIS — H9209 Otalgia, unspecified ear: Secondary | ICD-10-CM | POA: Insufficient documentation

## 2015-12-23 DIAGNOSIS — M791 Myalgia: Secondary | ICD-10-CM | POA: Insufficient documentation

## 2015-12-23 DIAGNOSIS — J069 Acute upper respiratory infection, unspecified: Secondary | ICD-10-CM | POA: Insufficient documentation

## 2015-12-23 MED ORDER — BENZONATATE 100 MG PO CAPS
100.0000 mg | ORAL_CAPSULE | Freq: Once | ORAL | Status: AC
  Administered 2015-12-23: 100 mg via ORAL
  Filled 2015-12-23: qty 1

## 2015-12-23 MED ORDER — BENZONATATE 100 MG PO CAPS: 100.0000 mg | ORAL_CAPSULE | Freq: Three times a day (TID) | ORAL | Status: DC

## 2015-12-23 MED ORDER — IBUPROFEN 800 MG PO TABS
800.0000 mg | ORAL_TABLET | Freq: Once | ORAL | Status: AC
  Administered 2015-12-23: 800 mg via ORAL
  Filled 2015-12-23: qty 1

## 2015-12-23 MED ORDER — IBUPROFEN 800 MG PO TABS: 800.0000 mg | ORAL_TABLET | Freq: Three times a day (TID) | ORAL | Status: DC

## 2015-12-23 MED FILL — IBUPROFEN 800 MG TABLET: 800 | 7 days supply | Qty: 21 | Fill #0

## 2015-12-23 MED FILL — BENZONATATE 100 MG CAPSULE: 100 | 7 days supply | Qty: 21 | Fill #0

## 2015-12-23 NOTE — Discharge Instructions (Signed)
1. Medications: tessalon for cough, ibuprofen for pain, usual home medications 2. Treatment: rest, drink plenty of fluids; try warm honey, tea, throat lozenges for additional symptom relief 3. Follow Up: please followup with your primary doctor in 2-3 days for discussion of your diagnoses and further evaluation after today's visit; if you do not have a primary care doctor use the phone number listed in your discharge paperwork to find one; please return to the ER for high fever, shortness of breath, new or worsening symptoms   Upper Respiratory Infection, Adult Most upper respiratory infections (URIs) are caused by a virus. A URI affects the nose, throat, and upper air passages. The most common type of URI is often called "the common cold." HOME CARE   Take medicines only as told by your doctor.  Gargle warm saltwater or take cough drops to comfort your throat as told by your doctor.  Use a warm mist humidifier or inhale steam from a shower to increase air moisture. This may make it easier to breathe.  Drink enough fluid to keep your pee (urine) clear or pale yellow.  Eat soups and other clear broths.  Have a healthy diet.  Rest as needed.  Go back to work when your fever is gone or your doctor says it is okay.  You may need to stay home longer to avoid giving your URI to others.  You can also wear a face mask and wash your hands often to prevent spread of the virus.  Use your inhaler more if you have asthma.  Do not use any tobacco products, including cigarettes, chewing tobacco, or electronic cigarettes. If you need help quitting, ask your doctor. GET HELP IF:  You are getting worse, not better.  Your symptoms are not helped by medicine.  You have chills.  You are getting more short of breath.  You have brown or red mucus.  You have yellow or brown discharge from your nose.  You have pain in your face, especially when you bend forward.  You have a fever.  You have  puffy (swollen) neck glands.  You have pain while swallowing.  You have white areas in the back of your throat. GET HELP RIGHT AWAY IF:   You have very bad or constant:  Headache.  Ear pain.  Pain in your forehead, behind your eyes, and over your cheekbones (sinus pain).  Chest pain.  You have long-lasting (chronic) lung disease and any of the following:  Wheezing.  Long-lasting cough.  Coughing up blood.  A change in your usual mucus.  You have a stiff neck.  You have changes in your:  Vision.  Hearing.  Thinking.  Mood. MAKE SURE YOU:   Understand these instructions.  Will watch your condition.  Will get help right away if you are not doing well or get worse.   This information is not intended to replace advice given to you by your health care provider. Make sure you discuss any questions you have with your health care provider.   Document Released: 03/08/2008 Document Revised: 02/04/2015 Document Reviewed: 12/26/2013 Elsevier Interactive Patient Education [employer]2016 Elsevier Inc.

## 2015-12-23 NOTE — ED Notes (Signed)
Pt having cough, generalized aches and pains, bilateral ear pain, dizziness for three days.  No known illness.

## 2015-12-23 NOTE — ED Provider Notes (Signed)
CSN: [id number]     Arrival date & time 12/23/15  1355 History   First MD Initiated Contact with Patient 12/23/15 1440     Chief Complaint  Patient presents with  . URI    HPI   Jocelyn Adams is a 45 y.o. female with no pertinent PMH who presents to the ED with subjective fever, chills, body aches, left ear pain, and productive cough. She states her symptoms started 3 days ago and have been constant since that time. She denies exacerbating factors. She has not tried anything for symptom relief. She denies nasal congestion, sore throat, chest pain, shortness of breath, abdominal pain, N/V/D. She denies sick contact.   No past medical history on file. Past Surgical History  Procedure Laterality Date  . Uterine fibroid surgery    . Abdominal hysterectomy     No family history on file. Social History  Substance Use Topics  . Smoking status: Current Every Day Smoker -- 0.50 packs/day    Types: Cigarettes  . Smokeless tobacco: Never Used  . Alcohol Use: No   OB History    No data available      Review of Systems  Constitutional: Positive for fever, chills and fatigue.  HENT: Positive for ear pain. Negative for congestion and sore throat.   Respiratory: Positive for cough.   Cardiovascular: Negative for chest pain.  Gastrointestinal: Negative for nausea, vomiting and abdominal pain.  Musculoskeletal: Positive for myalgias.      Allergies  Review of patient's allergies indicates no known allergies.  Home Medications   Prior to Admission medications   Medication Sig Start Date End Date Taking? Authorizing Provider  benzonatate (TESSALON) 100 MG capsule Take 1 capsule (100 mg total) by mouth every 8 (eight) hours. 12/23/15   Mady GemmaElizabeth C Lleyton Byers, PA-C  ibuprofen (ADVIL,MOTRIN) 800 MG tablet Take 1 tablet (800 mg total) by mouth 3 (three) times daily. 12/23/15   Mady GemmaElizabeth C Rashaun Curl, PA-C  naproxen (NAPROSYN) 500 MG tablet Take 1 tablet (500 mg total) by mouth 2 (two) times  daily. 06/18/15   Renne CriglerJoshua Geiple, PA-C  nicotine polacrilex (NICORETTE) 4 MG gum Take 1 each (4 mg total) by mouth as needed for smoking cessation. 06/08/15   Selina CooleyKyle Flores, MD    BP 109/85 mmHg  Pulse 104  Temp(Src) 99.8 F (37.7 C) (Oral)  Resp 16  SpO2 99% Physical Exam  Constitutional: She is oriented to person, place, and time. She appears well-developed and well-nourished. No distress.  HENT:  Head: Normocephalic and atraumatic.  Right Ear: Hearing, tympanic membrane, external ear and ear canal normal.  Left Ear: Hearing, tympanic membrane, external ear and ear canal normal.  Nose: Nose normal.  Mouth/Throat: Uvula is midline, oropharynx is clear and moist and mucous membranes are normal.  Eyes: Conjunctivae, EOM and lids are normal. Pupils are equal, round, and reactive to light. Right eye exhibits no discharge. Left eye exhibits no discharge. No scleral icterus.  Neck: Normal range of motion. Neck supple.  Cardiovascular: Normal rate, regular rhythm, normal heart sounds, intact distal pulses and normal pulses.   Pulmonary/Chest: Effort normal and breath sounds normal. No respiratory distress. She has no wheezes. She has no rales.  Abdominal: Soft. Normal appearance and bowel sounds are normal. She exhibits no distension and no mass. There is no tenderness. There is no rigidity, no rebound and no guarding.  Musculoskeletal: Normal range of motion. She exhibits no edema or tenderness.  Neurological: She is alert and oriented to person,  place, and time.  Skin: Skin is warm, dry and intact. No rash noted. She is not diaphoretic. No erythema. No pallor.  Psychiatric: She has a normal mood and affect. Her speech is normal and behavior is normal.  Nursing note and vitals reviewed.   ED Course  Procedures (including critical care time)  Labs Review Labs Reviewed - No data to display  Imaging Review Dg Chest 2 View  12/23/2015  CLINICAL DATA:  Cough, congestion, fever EXAM: CHEST  2  VIEW COMPARISON:  None. FINDINGS: Normal mediastinum and cardiac silhouette. Normal pulmonary vasculature. No evidence of effusion, infiltrate, or pneumothorax. No acute bony abnormality. IMPRESSION: No acute cardiopulmonary process. Electronically Signed   By: Genevive Bi M.D.   On: 12/23/2015 15:10   I have personally reviewed and evaluated these images as part of my medical decision-making.   EKG Interpretation None      MDM   Final diagnoses:  URI (upper respiratory infection)    45 year old female presents with subjective fever, chills, body aches, left ear pain, and productive cough x 3 days. Patient's temp 99.8 TMs clear bilaterally. No erythema, edema, or exudate to posterior oropharynx. Lungs clear to auscultation bilaterally. Patient given motrin and tessalon for symptoms. CXR negative for acute cardiopulmonary process. Discussed findings with patient. She is non-toxic and well-appearing, feel she is stable for discharge at this time. Symptoms likely viral. Will give motrin and tessalon for home. Patient to follow-up with PCP. Return precautions discussed. Patient verbalizes her understanding and is in agreement with plan.  BP 109/85 mmHg  Pulse 104  Temp(Src) 99.8 F (37.7 C) (Oral)  Resp 16  SpO2 99%     Mady Gemma, PA-C 12/23/15 1657  Azalia Bilis, MD 12/25/15 1547

## 2015-12-28 ENCOUNTER — Encounter (HOSPITAL_BASED_OUTPATIENT_CLINIC_OR_DEPARTMENT_OTHER): Payer: Self-pay | Admitting: Emergency Medicine

## 2015-12-28 ENCOUNTER — Emergency Department (HOSPITAL_BASED_OUTPATIENT_CLINIC_OR_DEPARTMENT_OTHER)
Admission: EM | Admit: 2015-12-28 | Discharge: 2015-12-28 | Disposition: A | Discharge status: Home / Self Care | Payer: Self-pay | Attending: Emergency Medicine | Admitting: Emergency Medicine

## 2015-12-28 ENCOUNTER — Emergency Department (HOSPITAL_BASED_OUTPATIENT_CLINIC_OR_DEPARTMENT_OTHER): Payer: Self-pay

## 2015-12-28 DIAGNOSIS — M791 Myalgia: Secondary | ICD-10-CM | POA: Insufficient documentation

## 2015-12-28 DIAGNOSIS — J069 Acute upper respiratory infection, unspecified: Secondary | ICD-10-CM | POA: Insufficient documentation

## 2015-12-28 DIAGNOSIS — F17210 Nicotine dependence, cigarettes, uncomplicated: Secondary | ICD-10-CM | POA: Insufficient documentation

## 2015-12-28 LAB — RAPID STREP SCREEN (MED CTR MEBANE ONLY): Streptococcus, Group A Screen (Direct): NEGATIVE

## 2015-12-28 MED ORDER — ACETAMINOPHEN 500 MG PO TABS
1000.0000 mg | ORAL_TABLET | Freq: Once | ORAL | Status: AC
  Administered 2015-12-28: 1000 mg via ORAL
  Filled 2015-12-28: qty 2

## 2015-12-28 MED ORDER — PSEUDOEPHEDRINE HCL 30 MG PO TABS
30.0000 mg | ORAL_TABLET | Freq: Once | ORAL | Status: AC
Start: 2015-12-28 — End: 2015-12-28
  Administered 2015-12-28: 30 mg via ORAL
  Filled 2015-12-28: qty 1

## 2015-12-28 MED ORDER — IBUPROFEN 800 MG PO TABS
800.0000 mg | ORAL_TABLET | Freq: Once | ORAL | Status: AC
  Administered 2015-12-28: 800 mg via ORAL
  Filled 2015-12-28: qty 1

## 2015-12-28 NOTE — ED Provider Notes (Signed)
CSN: [id number]     Arrival date & time 12/28/15  1042 History   First MD Initiated Contact with Patient 12/28/15 1105     Chief Complaint  Patient presents with  . Cough     (Consider location/radiation/quality/duration/timing/severity/associated sxs/prior Treatment) Patient is a 45 y.o. female presenting with cough and general illness. The history is provided by the patient.  Cough Associated symptoms: chills, fever and myalgias   Associated symptoms: no chest pain, no headaches, no rhinorrhea, no shortness of breath and no wheezing   Illness Severity:  Moderate Onset quality:  Gradual Duration:  1 week Timing:  Constant Progression:  Unchanged Chronicity:  New Associated symptoms: congestion, cough, fever and myalgias   Associated symptoms: no chest pain, no headaches, no nausea, no rhinorrhea, no shortness of breath, no vomiting and no wheezing    45 yo F With a chief complaint of URI like symptoms. This been going on for about a week. Patient was seen in the emergency department and started on Tessalon Perles. Patient is concerned because she continues to have symptoms. Denies fever. She thinks that she is short of breath and sometimes. Denies shortness breath currently. Also has a sore throat after coughing. Is concerned because in her discharge paperwork etc. if he had a sore throat to return.  History reviewed. No pertinent past medical history. Past Surgical History  Procedure Laterality Date  . Uterine fibroid surgery    . Abdominal hysterectomy     History reviewed. No pertinent family history. Social History  Substance Use Topics  . Smoking status: Current Every Day Smoker -- 0.50 packs/day    Types: Cigarettes  . Smokeless tobacco: Never Used  . Alcohol Use: No   OB History    No data available     Review of Systems  Constitutional: Positive for fever and chills.  HENT: Positive for congestion. Negative for rhinorrhea.   Eyes: Negative for redness and  visual disturbance.  Respiratory: Positive for cough. Negative for shortness of breath and wheezing.   Cardiovascular: Negative for chest pain and palpitations.  Gastrointestinal: Negative for nausea and vomiting.  Genitourinary: Negative for dysuria and urgency.  Musculoskeletal: Positive for myalgias. Negative for arthralgias.  Skin: Negative for pallor and wound.  Neurological: Negative for dizziness and headaches.      Allergies  Review of patient's allergies indicates no known allergies.  Home Medications   Prior to Admission medications   Medication Sig Start Date End Date Taking? Authorizing Provider  benzonatate (TESSALON) 100 MG capsule Take 1 capsule (100 mg total) by mouth every 8 (eight) hours. 12/23/15  Yes Mady Gemma, PA-C  nicotine polacrilex (NICORETTE) 4 MG gum Take 1 each (4 mg total) by mouth as needed for smoking cessation. 06/08/15   Selina Cooley, MD   BP 145/103 mmHg  Pulse 88  Temp(Src) 98.2 F (36.8 C) (Oral)  Resp 20  Wt 164 lb (74.39 kg)  SpO2 98% Physical Exam  Constitutional: She is oriented to person, place, and time. She appears well-developed and well-nourished. No distress.  HENT:  Head: Normocephalic and atraumatic.  Swollen turbinates, posterior nasal drip, no noted sinus ttp, tm normal bilaterally.   Mild erythema to posterior oropharynx without tonsillar swelling or exudates.   Eyes: EOM are normal. Pupils are equal, round, and reactive to light.  Neck: Normal range of motion. Neck supple.  Cardiovascular: Normal rate and regular rhythm.  Exam reveals no gallop and no friction rub.   No murmur heard. Pulmonary/Chest:  Effort normal. She has no wheezes. She has no rales.  Abdominal: Soft. She exhibits no distension. There is no tenderness.  Musculoskeletal: She exhibits no edema or tenderness.  Neurological: She is alert and oriented to person, place, and time.  Skin: Skin is warm and dry. She is not diaphoretic.  Psychiatric: She  has a normal mood and affect. Her behavior is normal.  Nursing note and vitals reviewed.   ED Course  Procedures (including critical care time) Labs Review Labs Reviewed  RAPID STRE[facility]CREEN [facility])  CULTURE, GROUP A STREP [facility])    Imaging Review Dg Chest 2 View  12/28/2015  CLINICAL DATA:  Subsequent encounter. 45 y/o female with dry cough x9 days, sore throat and worsening SOB x2 days. Was treated for cold on the 21st, onset sore throat after taking meds. Current smoker. EXAM: CHEST  2 VIEW COMPARISON:  12/23/2015 FINDINGS: Normal mediastinum and cardiac silhouette. Normal pulmonary vasculature. No evidence of effusion, infiltrate, or pneumothorax. No acute bony abnormality. IMPRESSION: No acute cardiopulmonary process.  No change from prior. Electronically Signed   By: Genevive BiStewart  Edmunds M.D.   On: 12/28/2015 11:42   I have personally reviewed and evaluated these images and lab results as part of my medical decision-making.   EKG Interpretation None      MDM   Final diagnoses:  URI (upper respiratory infection)    45 yo F with a chief complaint of sore throat and persistent cough with URI. Education at bedside. Nursing obtained rapid strep will wait for that return. Strep negative ,chest x-ray negative for pneumonia. Discharge home.   I have discussed the diagnosis/risks/treatment options with the patient and family and believe the pt to be eligible for discharge home to follow-up with PCP. We also discussed returning to the ED immediately if new or worsening sx occur. We discussed the sx which are most concerning (e.g., sudden worsening pain, fever, inability to tolerate by mouth) that necessitate immediate return. Medications administered to the patient during their visit and any new prescriptions provided to the patient are listed below.  Medications given during this visit Medications  acetaminophen (TYLENOL) tablet 1,000 mg (1,000 mg Oral Given 12/28/15 1141)  ibuprofen  (ADVIL,MOTRIN) tablet 800 mg (800 mg Oral Given 12/28/15 1141)  pseudoephedrine (SUDAFED) tablet 30 mg (30 mg Oral Given 12/28/15 1141)    Discharge Medication List as of 12/28/2015 12:08 PM      The patient appears reasonably screen and/or stabilized for discharge and I doubt any other medical condition or other [facility] requiring further screening, evaluation, or treatment in the ED at this time prior to discharge.     Melene Planan Lonzy Mato, DO 12/28/15 1457

## 2015-12-28 NOTE — ED Notes (Addendum)
Pt reports worsening of cold symptoms since seen 3/21, pt reports difficulty swallowing, denies fever, has ben taking tessalon pearls but did not get mucinex medication as requested by md during previous visit. Pt in nad in triage, talking on cell phone,

## 2015-12-28 NOTE — Discharge Instructions (Signed)

## 2015-12-31 LAB — CULTURE, GROUP A STREP (THRC)

## 2016-11-04 ENCOUNTER — Encounter (HOSPITAL_COMMUNITY): Payer: Self-pay | Admitting: Emergency Medicine

## 2016-11-04 ENCOUNTER — Emergency Department (HOSPITAL_COMMUNITY)
Admission: EM | Admit: 2016-11-04 | Discharge: 2016-11-04 | Disposition: A | Discharge status: Home / Self Care | Payer: Self-pay | Attending: Emergency Medicine | Admitting: Emergency Medicine

## 2016-11-04 DIAGNOSIS — Z79899 Other long term (current) drug therapy: Secondary | ICD-10-CM | POA: Insufficient documentation

## 2016-11-04 DIAGNOSIS — Z711 Person with feared health complaint in whom no diagnosis is made: Secondary | ICD-10-CM

## 2016-11-04 DIAGNOSIS — Z202 Contact with and (suspected) exposure to infections with a predominantly sexual mode of transmission: Secondary | ICD-10-CM | POA: Insufficient documentation

## 2016-11-04 DIAGNOSIS — L731 Pseudofolliculitis barbae: Secondary | ICD-10-CM | POA: Insufficient documentation

## 2016-11-04 DIAGNOSIS — F17210 Nicotine dependence, cigarettes, uncomplicated: Secondary | ICD-10-CM | POA: Insufficient documentation

## 2016-11-04 LAB — WET PREP, GENITAL
Sperm: NONE SEEN
Trich, Wet Prep: NONE SEEN
WBC WET PREP: NONE SEEN
Yeast Wet Prep HPF POC: NONE SEEN

## 2016-11-04 MED ORDER — LIDOCAINE HCL 2 % IJ SOLN
INTRAMUSCULAR | Status: AC
  Filled 2016-11-04: qty 20

## 2016-11-04 MED ORDER — LIDOCAINE-EPINEPHRINE (PF) 2 %-1:200000 IJ SOLN: 10.0000 mL | Freq: Once | INTRAMUSCULAR | Status: DC

## 2016-11-04 NOTE — ED Triage Notes (Signed)
Patient reports abscess to top of vagina x1 week. Patient reports area is red. Hx abscess. Denies fevers.

## 2016-11-04 NOTE — ED Provider Notes (Signed)
[facility]-EMERGENCY DEPT Provider Note   CSN: [id number] Arrival date & time: 11/04/16  1447  By signing my name below, I, Sonum Patel, attest that this documentation has been prepared under the direction and in the presence of [employer], PA-C. Electronically Signed: Sonum Patel, Neurosurgeon. 11/04/16. 3:03 PM.  History   Chief Complaint Chief Complaint  Patient presents with  . Abscess    The history is provided by the patient. No language interpreter was used.     HPI Comments: Jocelyn Adams is a 46 y.o. female who presents to the Emergency Department complaining of a small area of swelling to the pubic area with associated drainage for the last 1 week. She reports shaving and after a few days she noticed the area. She reports similar symptoms in the past. She has applied an OTC razor burn ointment which reduced the swelling. She has some drainage from the area.   She would also like to be tested for STD. She is unsure of any abnormal vaginal discharge but is concerned for an STD. Has been monogamous with the same partner for three years.  History reviewed. No pertinent past medical history.  There are no active problems to display for this patient.   Past Surgical History:  Procedure Laterality Date  . ABDOMINAL HYSTERECTOMY    . UTERINE FIBROID SURGERY      OB History    No data available       Home Medications    Prior to Admission medications   Medication Sig Start Date End Date Taking? Authorizing Provider  benzonatate (TESSALON) 100 MG capsule Take 1 capsule (100 mg total) by mouth every 8 (eight) hours. 12/23/15   Mady Gemma, PA-C  nicotine polacrilex (NICORETTE) 4 MG gum Take 1 each (4 mg total) by mouth as needed for smoking cessation. 06/08/15   Selina Cooley, MD    Family History History reviewed. No pertinent family history.  Social History Social History  Substance Use Topics  . Smoking status: Current Every Day Smoker    Packs/day: 0.50    Types:  Cigarettes  . Smokeless tobacco: Never Used  . Alcohol use No     Allergies   Patient has no known allergies.   Review of Systems Review of Systems  Genitourinary: Positive for vaginal discharge.  Skin: Positive for wound.     Physical Exam Updated Vital Signs BP 104/84 (BP Location: Left Arm)   Pulse 85   Temp 98.9 F (37.2 C) (Oral)   Resp 16   Ht 5\' 1"  (1.549 m)   Wt 174 lb 3.2 oz (79 kg)   SpO2 100%   BMI 32.91 kg/m   Physical Exam  Constitutional: She is oriented to person, place, and time. She appears well-developed and well-nourished.  HENT:  Head: Normocephalic and atraumatic.  Cardiovascular: Normal rate.   Pulmonary/Chest: Effort normal.  Genitourinary:  Genitourinary Comments: No inguinal lymphadenopathy or inguinal hernia noted. Normal external genitalia. No pain with speculum insertion. Closed cervical os with normal appearance - no rash or lesions. No significant discharge or bleeding noted from cervix or in vaginal vault. On bimanual examination no adnexal tenderness or cervical motion tenderness. Chaperone Maryruth Hancock, RN) present during exam.    Neurological: She is alert and oriented to person, place, and time.  Skin: Skin is warm and dry.  Singular ingrown hair over the mons pubis   Psychiatric: She has a normal mood and affect.  Nursing note and vitals reviewed.  ED Treatments / Results  DIAGNOSTIC STUDIES: Oxygen Saturation is 100% on RA, normal by my interpretation.    COORDINATION OF CARE: 2:59 PM Discussed treatment plan with pt at bedside and pt agreed to plan.   Labs (all labs ordered are listed, but only abnormal results are displayed) Labs Reviewed - No data to display  EKG  EKG Interpretation None       Radiology No results found.  Procedures .Foreign Body Removal Date/Time: 11/04/2016 3:28 PM Performed by: Terance HartGEKAS, KELLY MARIE Authorized by: Bethel BornGEKAS, KELLY MARIE  Consent: Verbal consent obtained. Risks and benefits:  risks, benefits and alternatives were discussed Consent given by: patient Patient understanding: patient states understanding of the procedure being performed Patient consent: the patient's understanding of the procedure matches consent given Procedure consent: procedure consent matches procedure scheduled Relevant documents: relevant documents present and verified Site marked: the operative site was marked Time out: Immediately prior to procedure a "time out" was called to verify the correct patient, procedure, equipment, support staff and site/side marked as required. Body area: skin General location: trunk Location details: groin Anesthesia: local infiltration  Anesthesia: Local Anesthetic: lidocaine 2% without epinephrine Anesthetic total: 3 mL  Sedation: Patient sedated: no Patient restrained: no Patient cooperative: yes Removal mechanism: forceps Complexity: simple 1 objects recovered. Objects recovered: Hair Post-procedure assessment: foreign body removed Patient tolerance: Patient tolerated the procedure well with no immediate complications   (including critical care time)  Medications Ordered in ED Medications  lidocaine-EPINEPHrine (XYLOCAINE W/EPI) 2 %-1:200000 (PF) injection 10 mL (not administered)  lidocaine (XYLOCAINE) 2 % (with pres) injection (not administered)     Initial Impression / Assessment and Plan / ED Course  I have reviewed the triage vital signs and the nursing notes.  Pertinent labs & imaging results that were available during my care of the patient were reviewed by me and considered in my medical decision making (see chart for details).  46 year old female presents with folliculitis due to ingrown pubic hair due to shaving and STD check. Discussed that this is not a STD and it is an infected hair follicle however she still wants to be tested for STDs. Pelvic exam is unremarkable. G&D and labs sent. Hair was removed with forceps and pt tolerated  well. Return precautions given.   Final Clinical Impressions(s) / ED Diagnoses   Final diagnoses:  Ingrown hair  Concern about STD in female without diagnosis    New Prescriptions New Prescriptions   No medications on file   I personally performed the services described in this documentation, which was scribed in my presence. The recorded information has been reviewed and is accurate.    Bethel BornKelly Marie Gekas, PA-C 11/04/16 1752    Derwood KaplanAnkit Nanavati, MD 11/06/16 [phone]

## 2016-11-04 NOTE — Discharge Instructions (Signed)
You results will be available in MyChart in several days Return for worsening symptoms

## 2016-11-05 LAB — HSV 2 ANTIBODY, IGG: HSV 2 Glycoprotein G Ab, IgG: >23.6 index — ABNORMAL HIGH (ref 0.00–0.90)

## 2016-11-05 LAB — HIV ANTIBODY (ROUTINE TESTING W REFLEX): HIV Screen 4th Generation wRfx: NONREACTIVE

## 2016-11-05 LAB — GC/CHLAMYDIA PROBE AMP (~~LOC~~) NOT AT ARMC
CHLAMYDIA, DNA PROBE: NEGATIVE
Neisseria Gonorrhea: NEGATIVE

## 2016-11-05 LAB — RPR: RPR Ser Ql: NONREACTIVE

## 2017-05-31 IMAGING — CT CT ABD-PELV W/ CM
2 of 5 series · 16 of 46 positions shown, 18 images · IV contrast (APPLIED)
Comparison: Ultrasound the abdomen 08/19/2009

CLINICAL DATA: Right-sided abdominal pain and pelvic pain for 3
days. No nausea, vomiting, diarrhea. History of fibroids. Previous
hysterectomy. Ovary was removed. White cell count is normal today.

EXAM:
CT ABDOMEN AND PELVIS WITH CONTRAST
TECHNIQUE: Multidetector CT imaging of the abdomen and pelvis was performed
using the standard protocol following bolus administration of
intravenous contrast.
CONTRAST:  100mL OMNIPAQUE IOHEXOL 300 MG/ML SOLN, 25mL OMNIPAQUE
IOHEXOL 300 MG/ML SOLN

[Series 2: abd/pelvis 5.0 b31f · axial · 0.75mm/px · z∈[-463,-58]mm · 13 of 91 slices shown, 15 images]
[im 5/91  soft-tissue]
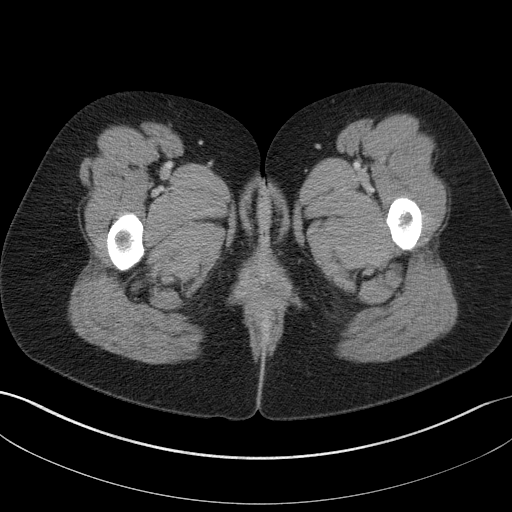
[im 5/91  bone]
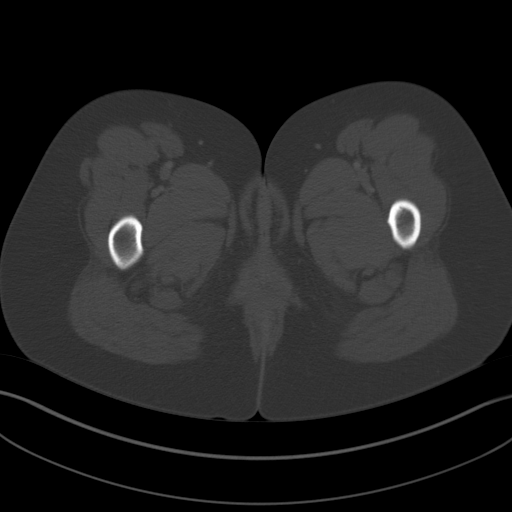
[im 14/91  soft-tissue]
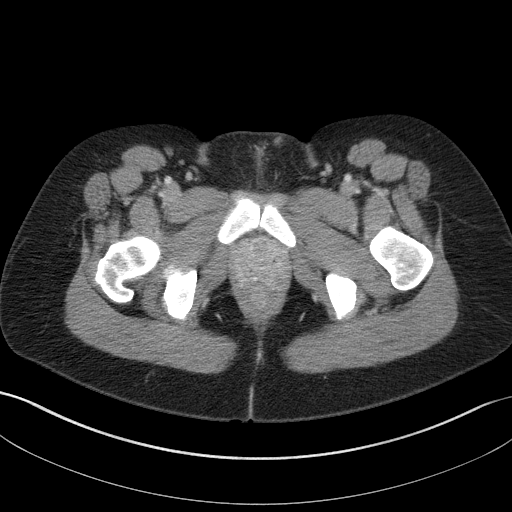
[im 19/91  soft-tissue]
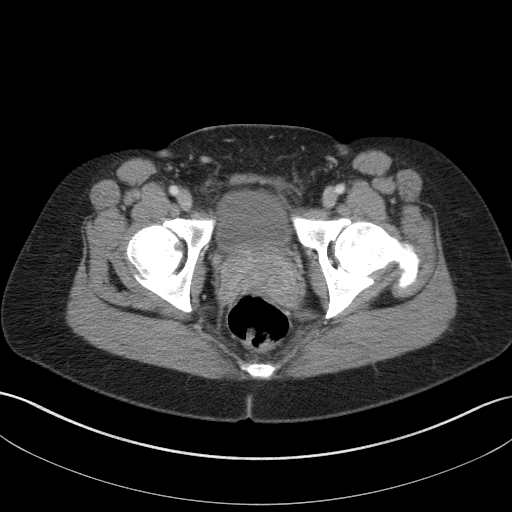
[im 28/91  soft-tissue]
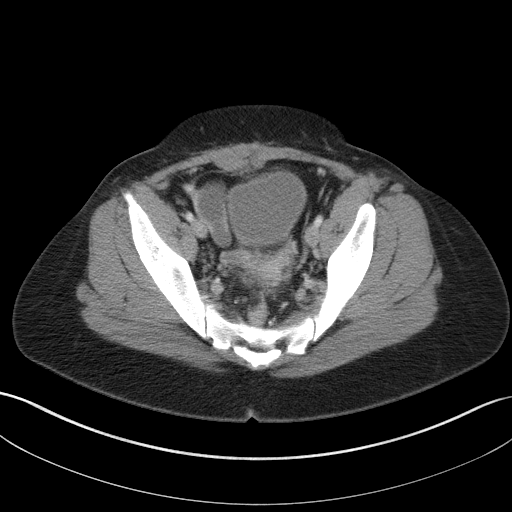
[im 32/91  soft-tissue]
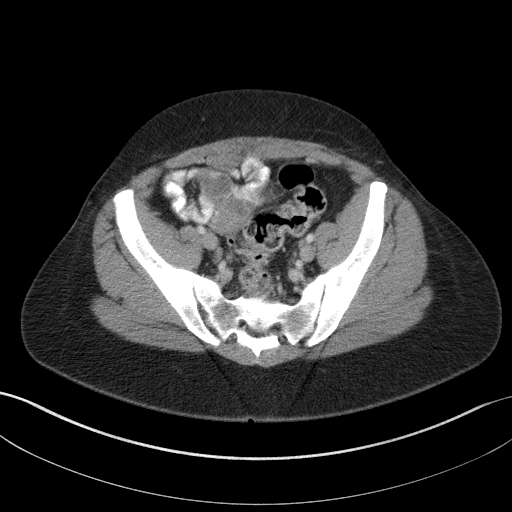
[im 41/91  soft-tissue]
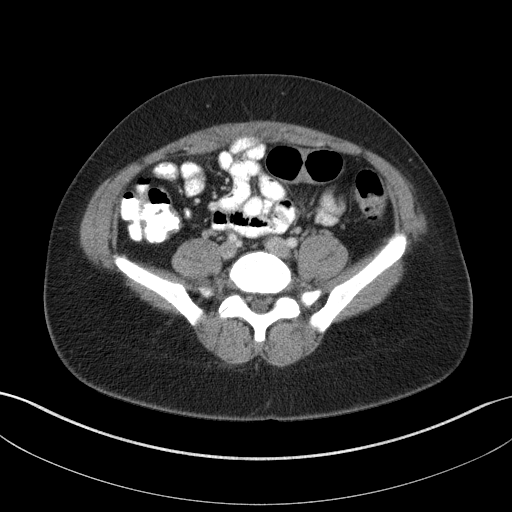
[im 46/91  soft-tissue]
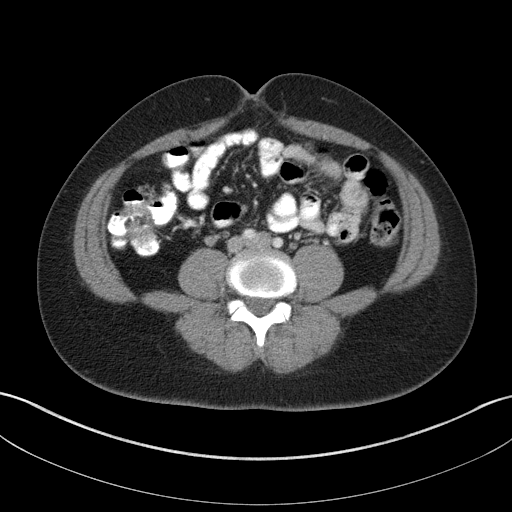
[im 50/91  soft-tissue]
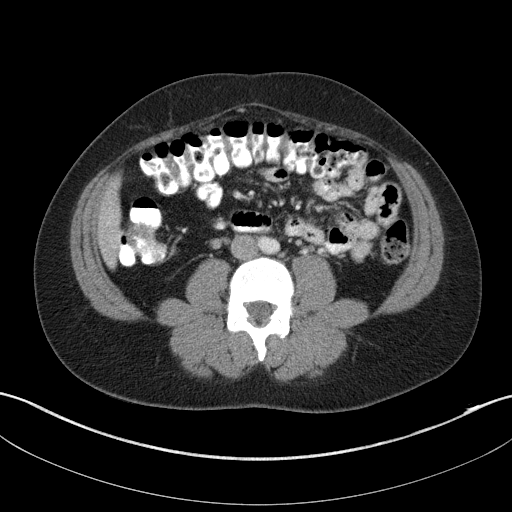
[im 59/91  soft-tissue]
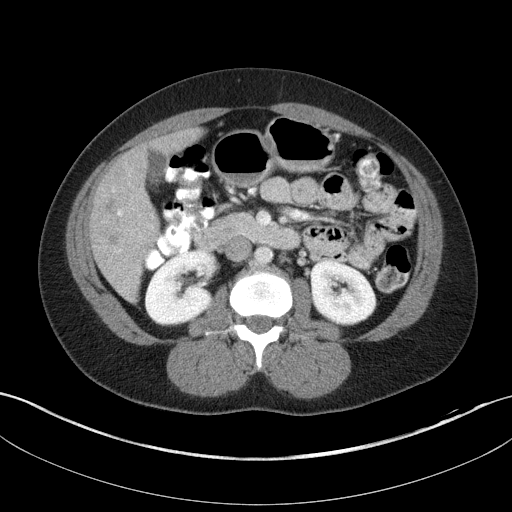
[im 59/91  bone]
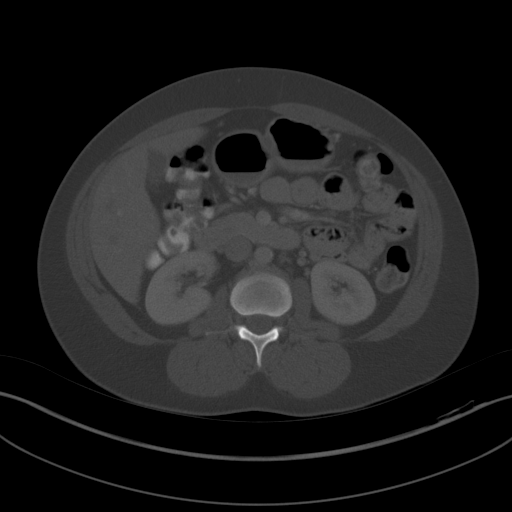
[im 64/91  soft-tissue]
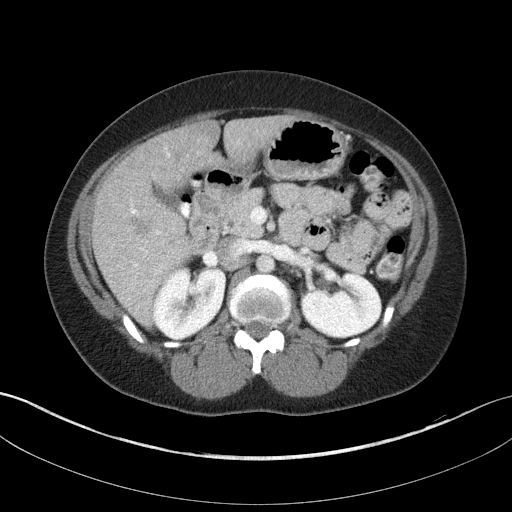
[im 73/91  soft-tissue]
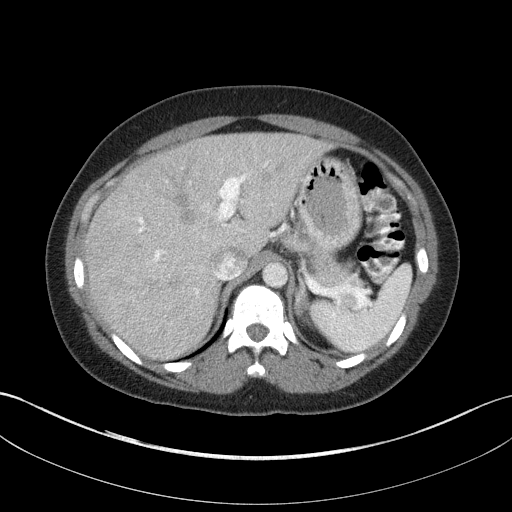
[im 77/91  soft-tissue]
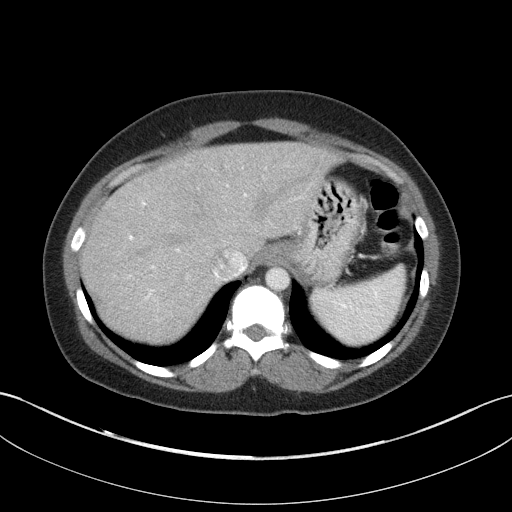
[im 86/91  soft-tissue]
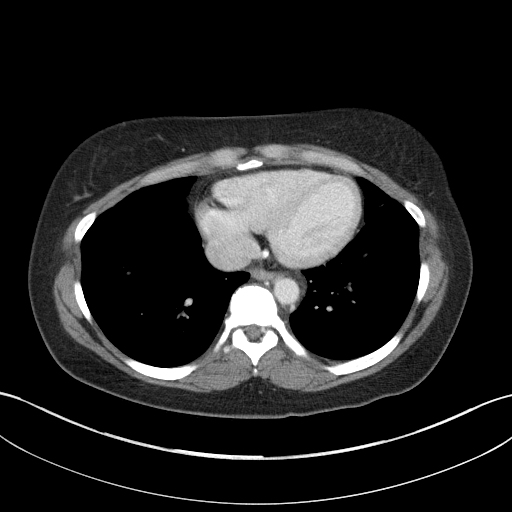

[Series 5: abd/pelvis 3.0 coronal · coronal · 0.88mm/px · 3 of 79 slices shown]
[im 27/79  soft-tissue]
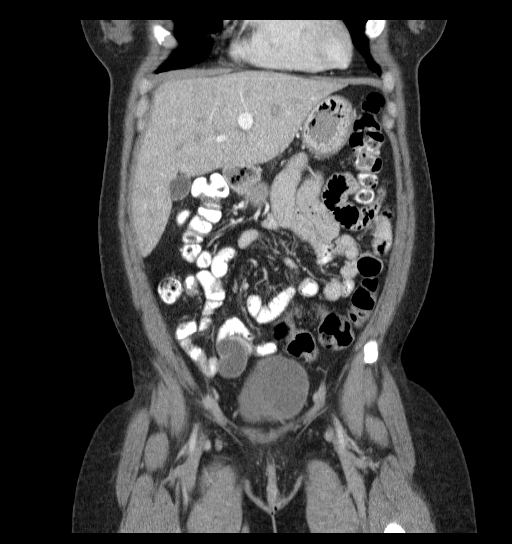
[im 35/79  soft-tissue]
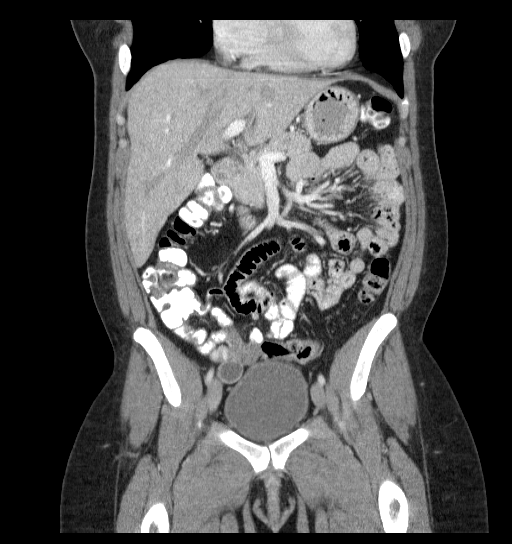
[im 44/79  soft-tissue]
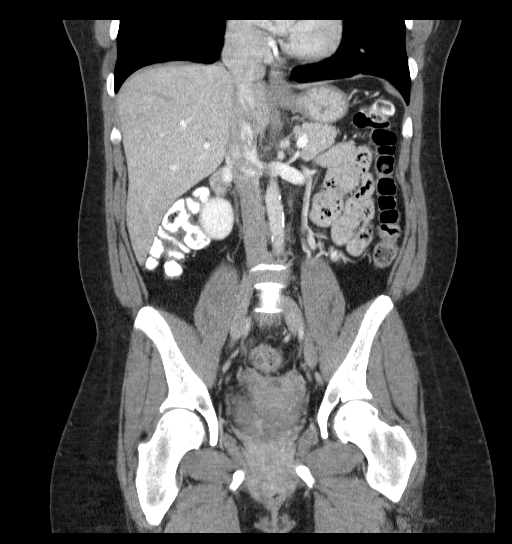

[16 of 46 positions shown; findings below may reference images not displayed]

FINDINGS: Lower chest: There is a pleural-based 4 mm nodule within the lower
aspect of the right middle lobe. No pleural effusions or focal
consolidations identified. Heart size is normal. No imaged
pericardial effusion or significant coronary artery calcifications.

Upper abdomen: No focal abnormality identified within the liver,
spleen, pancreas. Small left adrenal nodules measure less than 5 mm
in diameter and are consistent with adrenal adenomas or hyperplasia.
The gallbladder is present.

Gastrointestinal tract: The stomach and small bowel loops are normal
in appearance. Colonic loops are normal in appearance. The appendix
is well seen and has a normal appearance.

Pelvis: The lower portion of the uterus is present, likely
indicating previous supracervical hysterectomy. Within the right
pelvis there is a tubular structure adjacent to small area of soft
tissue which likely represents the right ovary. Findings are
suspicious for tubo-ovarian abscess or hydrosalpinx. Correlation
with clinical findings is recommended. Consider further evaluation
with transabdominal and transvaginal pelvic ultrasound. The left
ovary may be absent. There is no left adnexal mass. There is no free
pelvic fluid.

Retroperitoneum: There is mild atherosclerotic calcification of the
abdominal aorta. No aneurysm or dissection.

Abdominal wall: No retroperitoneal or mesenteric adenopathy.

Osseous structures: Unremarkable.
IMPRESSION: 1. A tubular structure within the right hemipelvis appears to be
adjacent to the right ovary. Favor tubo-ovarian abscess or
hydrosalpinx. Further evaluation with pelvic ultrasound recommended.
2. Supracervical hysterectomy.
3. 4 mm nodule within the right middle lobe. If the patient is at
high risk for bronchogenic carcinoma, follow-up chest CT at 1 year
is recommended. If the patient is at low risk, no follow-up is
needed. This recommendation follows the consensus statement:
Guidelines for Management of Small Pulmonary Nodules Detected on CT
Scans: A Statement from the [HOSPITAL] as published in
4. Benign left adrenal hyperplasia or small adenomas.
5. Normal appendix.

## 2017-08-06 IMAGING — US US TRANSVAGINAL NON-OB
1 series · 13 of 25 positions shown · non-contrast
Comparison: None

CLINICAL DATA: 44-year-old female with right pelvic pain, nausea
and vomiting for 3 days. History of hysterectomy and left
oophorectomy.

EXAM:
TRANSABDOMINAL AND TRANSVAGINAL ULTRASOUND OF PELVIS
TECHNIQUE: Both transabdominal and transvaginal ultrasound examinations of the
pelvis were performed. Transabdominal technique was performed for
global imaging of the pelvis including uterus, ovaries, adnexal
regions, and pelvic cul-de-sac. It was necessary to proceed with
endovaginal exam following the transabdominal exam to visualize the
adnexal regions.

[Series 1: us transvaginal non-ob · 0.22mm/px · 13 of 95 slices shown]
[im 1/95]
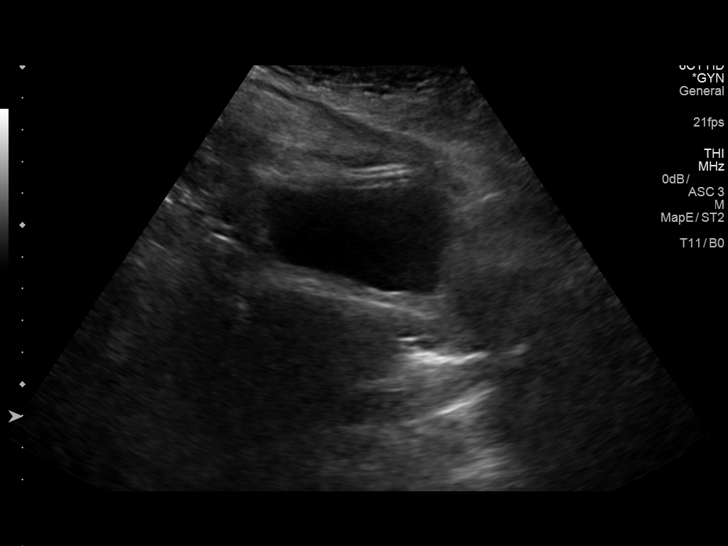
[im 8/95]
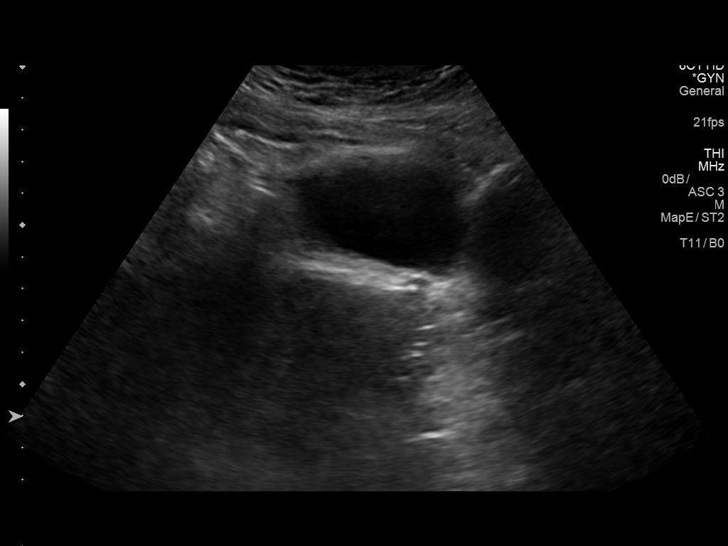
[im 16/95]
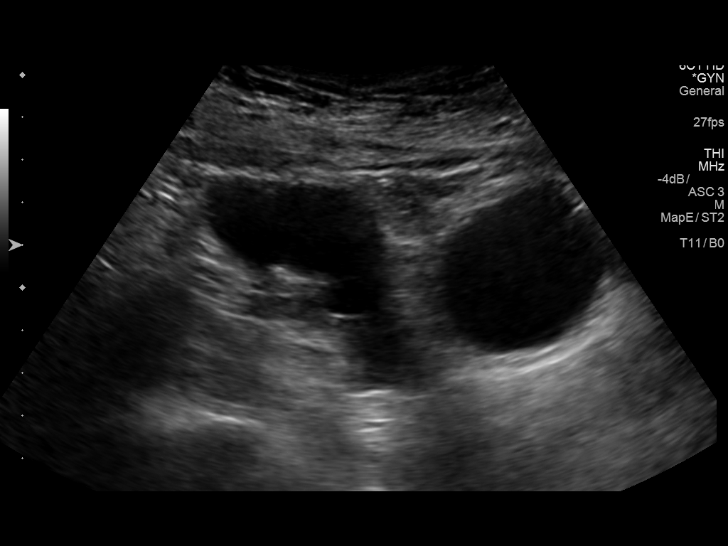
[im 24/95]
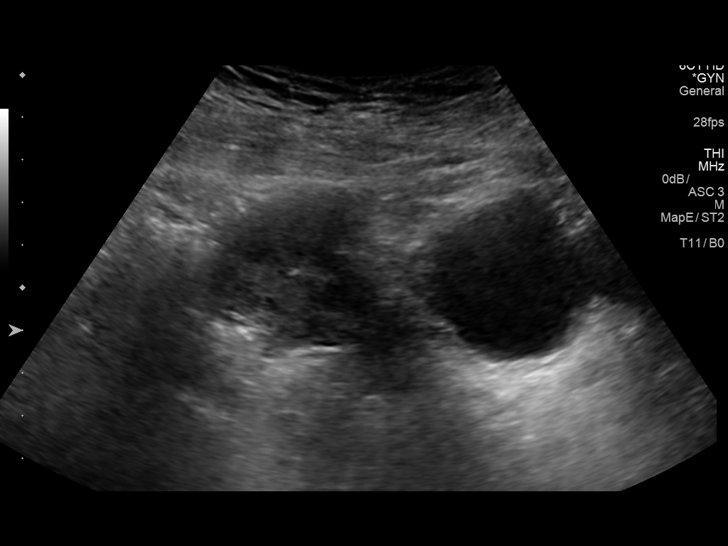
[im 32/95]
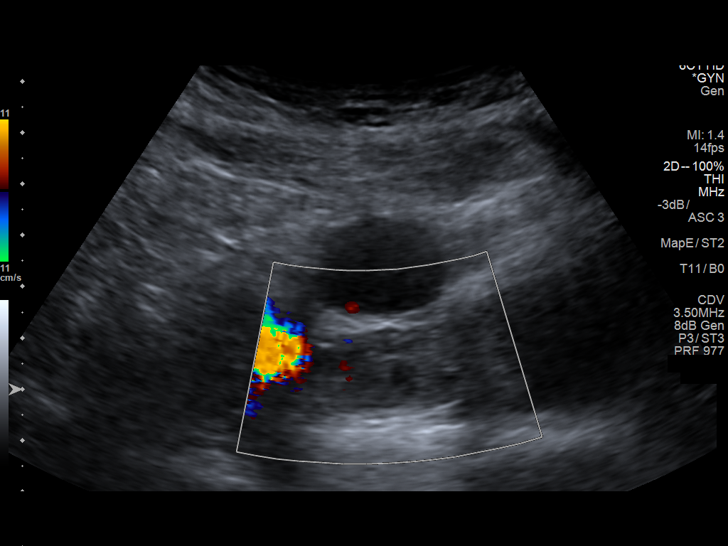
[im 40/95]
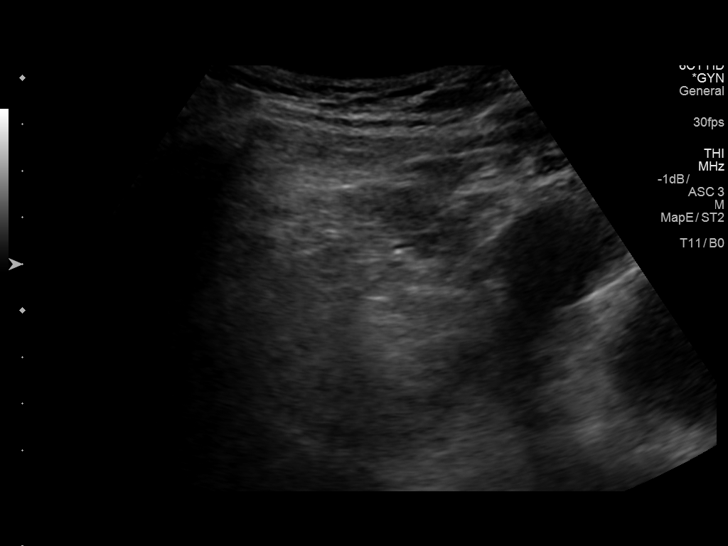
[im 48/95]
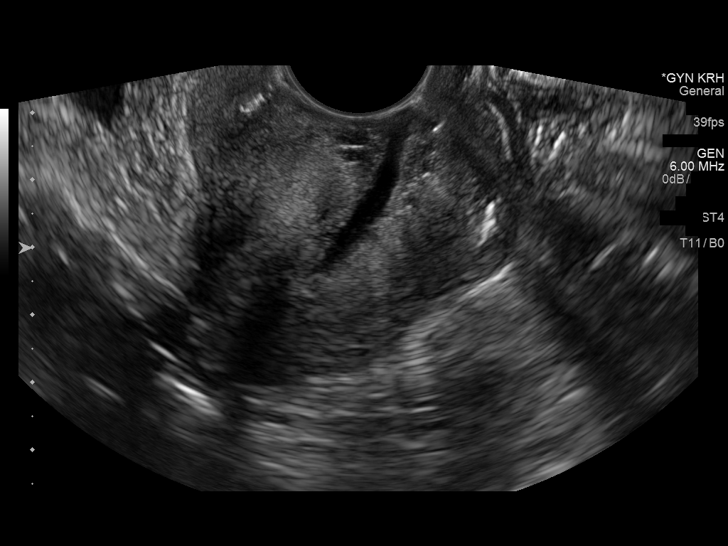
[im 55/95]
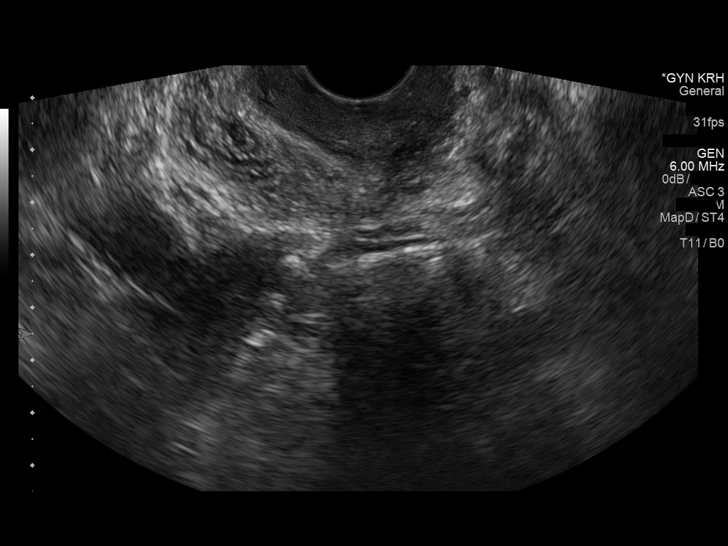
[im 63/95]
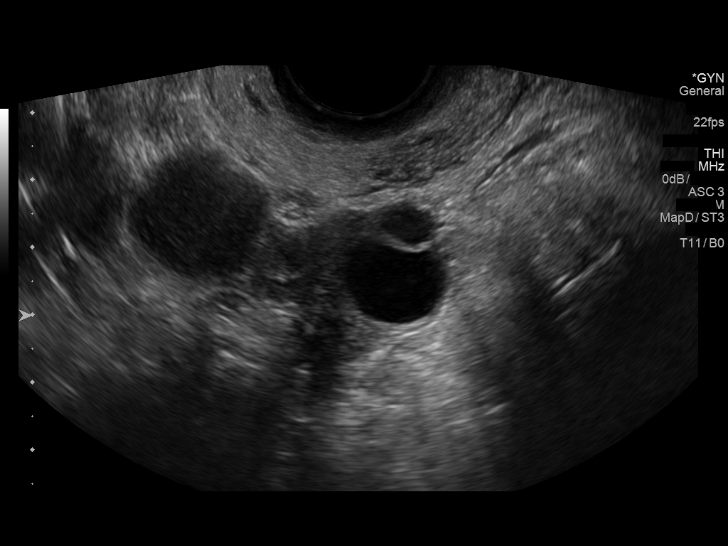
[im 71/95]
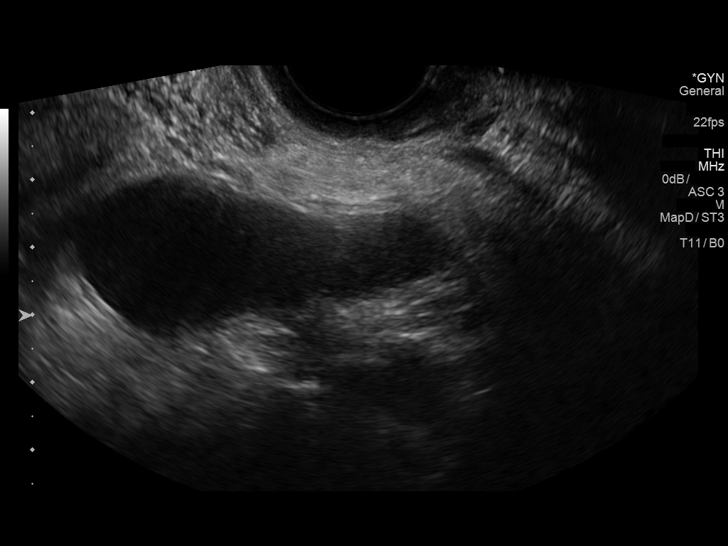
[im 79/95]
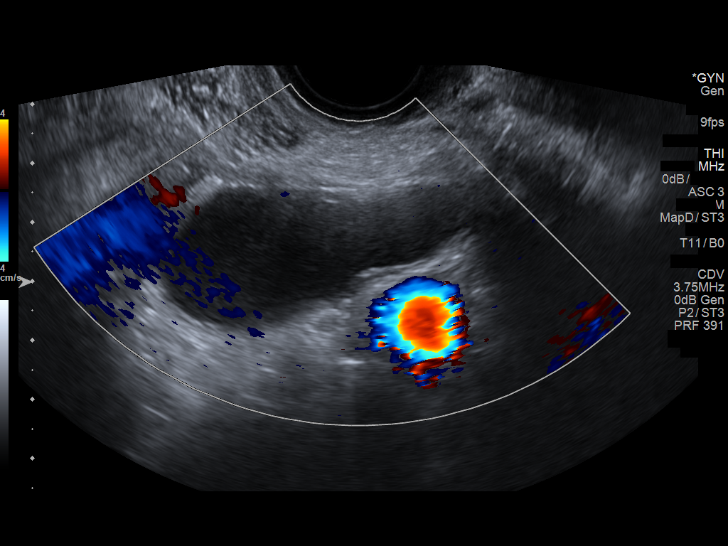
[im 87/95]
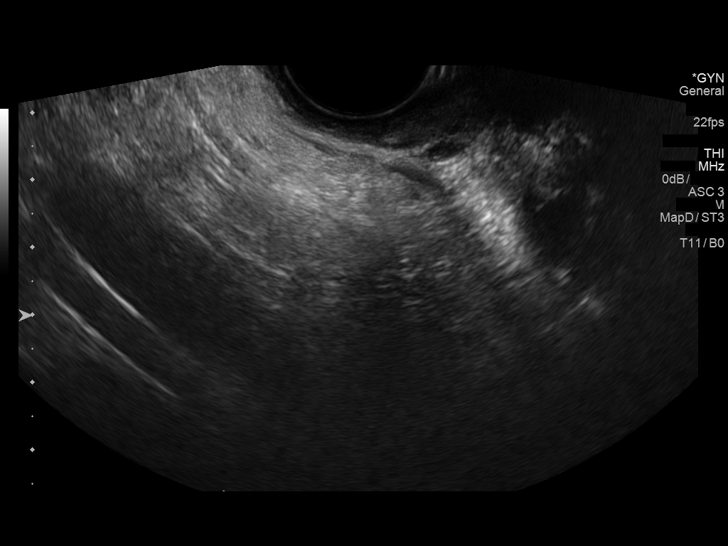
[im 95/95]
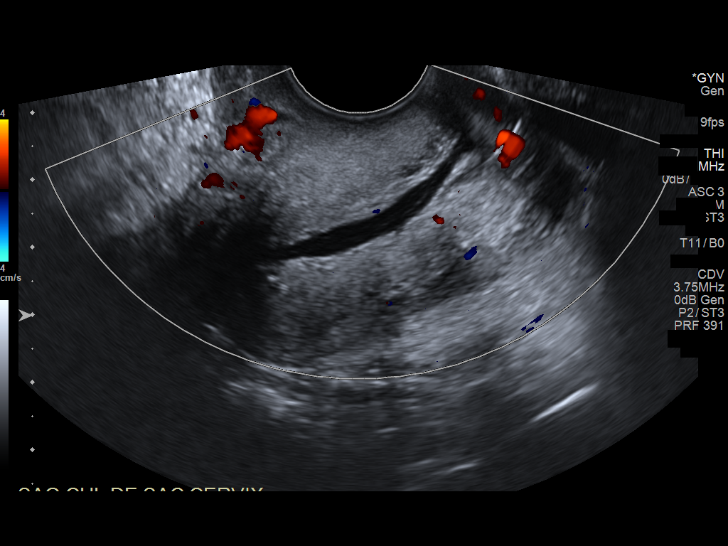

[13 of 25 positions shown; findings below may reference images not displayed]

FINDINGS: Uterus

Not visualized compatible with hysterectomy.

Right ovary

Measurements: 3.5 x 2.2 x 2.9 cm. Normal appearance/no adnexal mass.

Left ovary

Not visualized compatible with left oophorectomy.

Other findings

Small of fluid in the cervical/vaginal canal noted.

A 2.7 x 2.8 x 5.7 cm tubular structure containing fluid within the
right adnexal region probably represents hydrosalpinx, with
infection less likely. There is no evidence of solid adnexal mass.
IMPRESSION: 2.7 x 2.9 x 5.8 cm tubular structure containing fluid in the right
adnexal region, likely hydrosalpinx. Infection is considered less
likely.

Normal right ovary.

Status post hysterectomy and left oophorectomy.

## 2017-09-21 ENCOUNTER — Other Ambulatory Visit: Payer: Self-pay

## 2017-09-21 ENCOUNTER — Emergency Department (HOSPITAL_COMMUNITY)
Admission: EM | Admit: 2017-09-21 | Discharge: 2017-09-21 | Discharge status: Against Medical Advice | Payer: Self-pay | Attending: Emergency Medicine | Admitting: Emergency Medicine

## 2017-09-21 ENCOUNTER — Encounter (HOSPITAL_COMMUNITY): Payer: Self-pay | Admitting: Nurse Practitioner

## 2017-09-21 DIAGNOSIS — R2232 Localized swelling, mass and lump, left upper limb: Secondary | ICD-10-CM | POA: Insufficient documentation

## 2017-09-21 DIAGNOSIS — Z5321 Procedure and treatment not carried out due to patient leaving prior to being seen by health care provider: Secondary | ICD-10-CM | POA: Insufficient documentation

## 2017-09-21 NOTE — ED Notes (Signed)
Patient called for rooming x2. With no response, lobby, bathroom, and outside area checked with no visibility of patient.

## 2017-09-21 NOTE — ED Triage Notes (Signed)
Pt presents with c/o left arm swelling x3-4 days. Patient denies any injury that she can remember to that arm. States she has been taking Tylenol for pain. Denies any fever. Pt is A&O x4 and ambulatory. Denies any shortness of breath or chest pains.

## 2017-09-21 NOTE — ED Notes (Signed)
Patient called for rooming. No response or visibility.

## 2018-03-16 ENCOUNTER — Emergency Department (HOSPITAL_BASED_OUTPATIENT_CLINIC_OR_DEPARTMENT_OTHER): Payer: Self-pay

## 2018-03-16 ENCOUNTER — Emergency Department (HOSPITAL_BASED_OUTPATIENT_CLINIC_OR_DEPARTMENT_OTHER)
Admission: EM | Admit: 2018-03-16 | Discharge: 2018-03-17 | Disposition: A | Discharge status: Home / Self Care | Payer: Self-pay | Attending: Emergency Medicine | Admitting: Emergency Medicine

## 2018-03-16 ENCOUNTER — Encounter (HOSPITAL_BASED_OUTPATIENT_CLINIC_OR_DEPARTMENT_OTHER): Payer: Self-pay | Admitting: *Deleted

## 2018-03-16 ENCOUNTER — Other Ambulatory Visit: Payer: Self-pay

## 2018-03-16 DIAGNOSIS — M791 Myalgia, unspecified site: Secondary | ICD-10-CM

## 2018-03-16 DIAGNOSIS — M7918 Myalgia, other site: Secondary | ICD-10-CM | POA: Insufficient documentation

## 2018-03-16 DIAGNOSIS — F17210 Nicotine dependence, cigarettes, uncomplicated: Secondary | ICD-10-CM | POA: Insufficient documentation

## 2018-03-16 DIAGNOSIS — M5410 Radiculopathy, site unspecified: Secondary | ICD-10-CM | POA: Insufficient documentation

## 2018-03-16 NOTE — ED Triage Notes (Addendum)
Pt reports HA, bilateral arm pain, neck pain and chest pain x over 1 week. Denies injury. Denies N/V/D.  Denies SOB. Ambulatory. Reports pain is intermittent.  PT reports that her pain is relieved with 'dropping apple cider vinegar'

## 2018-03-17 ENCOUNTER — Encounter (HOSPITAL_BASED_OUTPATIENT_CLINIC_OR_DEPARTMENT_OTHER): Payer: Self-pay | Admitting: Emergency Medicine

## 2018-03-17 LAB — CBC WITH DIFFERENTIAL/PLATELET
Basophils Absolute: 0 10*3/uL (ref 0.0–0.1)
Basophils Relative: 0 %
EOS PCT: 3 %
Eosinophils Absolute: 0.3 10*3/uL (ref 0.0–0.7)
HCT: 40.2 % (ref 36.0–46.0)
Hemoglobin: 13.1 g/dL (ref 12.0–15.0)
LYMPHS ABS: 2.4 10*3/uL (ref 0.7–4.0)
Lymphocytes Relative: 26 %
MCH: 29.6 pg (ref 26.0–34.0)
MCHC: 32.6 g/dL (ref 30.0–36.0)
MCV: 91 fL (ref 78.0–100.0)
MONO ABS: 0.4 10*3/uL (ref 0.1–1.0)
Monocytes Relative: 5 %
Neutro Abs: 5.9 10*3/uL (ref 1.7–7.7)
Neutrophils Relative %: 66 %
PLATELETS: 206 10*3/uL (ref 150–400)
RBC: 4.42 MIL/uL (ref 3.87–5.11)
RDW: 13.3 % (ref 11.5–15.5)
WBC: 8.9 10*3/uL (ref 4.0–10.5)

## 2018-03-17 LAB — BASIC METABOLIC PANEL
Anion gap: 7 (ref 5–15)
BUN: 18 mg/dL (ref 6–20)
CO2: 29 mmol/L (ref 22–32)
Calcium: 8.9 mg/dL (ref 8.9–10.3)
Chloride: 106 mmol/L (ref 101–111)
Creatinine, Ser: 0.84 mg/dL (ref 0.44–1.00)
GFR calc Af Amer: >60 mL/min (ref >60)
GLUCOSE: 88 mg/dL (ref 65–99)
Potassium: 3.3 mmol/L — ABNORMAL LOW (ref 3.5–5.1)
Sodium: 142 mmol/L (ref 135–145)

## 2018-03-17 LAB — TROPONIN I: Troponin I: <0.03 ng/mL

## 2018-03-17 LAB — D-DIMER, QUANTITATIVE: D-Dimer, Quant: 0.5 ug/mL-FEU (ref 0.00–0.50)

## 2018-03-17 MED ORDER — DEXAMETHASONE SODIUM PHOSPHATE 10 MG/ML IJ SOLN
10.0000 mg | Freq: Once | INTRAMUSCULAR | Status: AC
  Administered 2018-03-17: 10 mg via INTRAVENOUS
  Filled 2018-03-17: qty 1

## 2018-03-17 MED ORDER — KETOROLAC TROMETHAMINE 30 MG/ML IJ SOLN
15.0000 mg | Freq: Once | INTRAMUSCULAR | Status: AC
  Administered 2018-03-17: 15 mg via INTRAVENOUS
  Filled 2018-03-17: qty 1

## 2018-03-17 MED ORDER — NAPROXEN 375 MG PO TABS: 375.0000 mg | ORAL_TABLET | Freq: Two times a day (BID) | ORAL | 0 refills | Status: DC

## 2018-03-17 MED ORDER — METHOCARBAMOL 500 MG PO TABS: 500.0000 mg | ORAL_TABLET | Freq: Two times a day (BID) | ORAL | 0 refills | Status: DC

## 2018-03-17 NOTE — ED Provider Notes (Signed)
[facility] EMERGENCY DEPARTMENT Provider Note   CSN: [id number] Arrival date & time: 03/16/18  2311     History   Chief Complaint Chief Complaint  Patient presents with  . Headache    HPI Jocelyn Adams is a 47 y.o. female.  The history is provided by the patient.  Shoulder Pain   This is a new problem. The current episode started more than 1 week ago (2 weeks ago). The problem occurs constantly. The problem has not changed since onset.The pain is present in the neck, right shoulder and left shoulder (neck pain into the occiput and shoulders B.  No weakness nor numbness). The quality of the pain is described as pounding and constant. The pain is severe. Associated symptoms include stiffness. Pertinent negatives include no numbness, full range of motion, no tingling and no itching. The symptoms are aggravated by activity. She has tried nothing for the symptoms. The treatment provided no relief. There has been no history of extremity trauma. Family history is significant for no rheumatoid arthritis.  No DOE, no extertional CP, no n/v/d. No weakness nor numbness. No long car trips or plane trips no OCP.  No leg pain or swelling.    History reviewed. No pertinent past medical history.  There are no active problems to display for this patient.   Past Surgical History:  Procedure Laterality Date  . ABDOMINAL HYSTERECTOMY    . UTERINE FIBROID SURGERY       OB History   None      Home Medications    Prior to Admission medications   Medication Sig Start Date End Date Taking? Authorizing Provider  benzonatate (TESSALON) 100 MG capsule Take 1 capsule (100 mg total) by mouth every 8 (eight) hours. 12/23/15   Mady Gemma, PA-C  methocarbamol (ROBAXIN) 500 MG tablet Take 1 tablet (500 mg total) by mouth 2 (two) times daily. 03/17/18   Morgyn Marut, MD  naproxen (NAPROSYN) 375 MG tablet Take 1 tablet (375 mg total) by mouth 2 (two) times daily. 03/17/18    Breindel Collier, MD  nicotine polacrilex (NICORETTE) 4 MG gum Take 1 each (4 mg total) by mouth as needed for smoking cessation. 06/08/15   Selina Cooley, MD    Family History History reviewed. No pertinent family history.  Social History Social History   Tobacco Use  . Smoking status: Current Every Day Smoker    Packs/day: 0.50    Types: Cigarettes  . Smokeless tobacco: Never Used  Substance Use Topics  . Alcohol use: No  . Drug use: No     Allergies   Patient has no known allergies.   Review of Systems Review of Systems  Constitutional: Negative for diaphoresis and fever.  Eyes: Negative for photophobia and visual disturbance.  Respiratory: Negative for chest tightness and shortness of breath.   Cardiovascular: Negative for palpitations and leg swelling.  Musculoskeletal: Positive for arthralgias, neck pain and stiffness. Negative for neck stiffness.  Skin: Negative for color change and itching.  Neurological: Negative for dizziness, tingling, facial asymmetry, weakness, light-headedness and numbness.  All other systems reviewed and are negative.    Physical Exam Updated Vital Signs BP 108/78 (BP Location: Right Arm)   Pulse 65   Temp 98.4 F (36.9 C) (Oral)   Resp 16   Ht 5\' 1"  (1.549 m)   Wt 73.9 kg (163 lb)   SpO2 100%   BMI 30.80 kg/m   Physical Exam  Constitutional: She is oriented to  person, place, and time. She appears well-developed and well-nourished. No distress.  HENT:  Head: Normocephalic and atraumatic.  Mouth/Throat: No oropharyngeal exudate.  Eyes: Pupils are equal, round, and reactive to light. Conjunctivae and EOM are normal.  Neck: Normal range of motion. Neck supple. No JVD present. No tracheal deviation present.  Cardiovascular: Normal rate, regular rhythm, normal heart sounds and intact distal pulses.  Pulmonary/Chest: Effort normal and breath sounds normal. No stridor. She has no wheezes. She has no rales.  Abdominal: Soft. Bowel sounds  are normal. She exhibits no mass. There is no tenderness. There is no rebound and no guarding.  Musculoskeletal: Normal range of motion.       Right shoulder: She exhibits tenderness. She exhibits normal range of motion, no bony tenderness, no swelling, no effusion, no crepitus, no deformity, no laceration, no pain, no spasm, normal pulse and normal strength.       Left shoulder: She exhibits tenderness. She exhibits normal range of motion, no bony tenderness, no swelling, no effusion, no crepitus, no deformity, no laceration, no pain, no spasm, normal pulse and normal strength.       Cervical back: Normal.       Thoracic back: Normal.       Lumbar back: Normal.  5/5 throughout  Lymphadenopathy:    She has no cervical adenopathy.  Neurological: She is alert and oriented to person, place, and time. She displays normal reflexes. No cranial nerve deficit or sensory deficit. She exhibits normal muscle tone. Coordination normal.  Skin: Skin is warm and dry. Capillary refill takes less than 2 seconds.  Psychiatric: She has a normal mood and affect.  Nursing note and vitals reviewed.    ED Treatments / Results  Labs (all labs ordered are listed, but only abnormal results are displayed) Labs Reviewed  BASIC METABOLIC PANEL - Abnormal; Notable for the following components:      Result Value   Potassium 3.3 (*)    All other components within normal limits  CBC WITH DIFFERENTIAL/PLATELET  TROPONIN I  D-DIMER, QUANTITATIVE (NOT AT [facility])    EKG EKG Interpretation  Date/Time:  Thursday March 16 2018 23:18:41 EDT Ventricular Rate:  79 PR Interval:  174 QRS Duration: 80 QT Interval:  386 QTC Calculation: 442 R Axis:   93 Text Interpretation:  Normal sinus rhythm Confirmed by Nicanor Alcon, Saron Vanorman ([id number]) on 03/16/2018 11:40:10 PM   Radiology Dg Chest 2 View  Result Date: 03/16/2018 CLINICAL DATA:  Left upper chest pain.  Fever. EXAM: CHEST - 2 VIEW COMPARISON:  December 28, 2015 FINDINGS: Minimal  opacity in the left base is favored to represent atelectasis rather than subtle infiltrate. No pneumothorax. No other interval changes or acute abnormalities. IMPRESSION: Mild opacity in left base is favored represent atelectasis. No other acute abnormalities. Electronically Signed   By: Gerome Sam III M.D   On: 03/16/2018 23:43    Procedures Procedures (including critical care time)  Medications Ordered in ED Medications  ketorolac (TORADOL) 30 MG/ML injection 15 mg (15 mg Intravenous Given 03/17/18 0028)  dexamethasone (DECADRON) injection 10 mg (10 mg Intravenous Given 03/17/18 0028)     HEART score is 1 very low risk for MACE.  Ruled out for PE.   Final Clinical Impressions(s) / ED Diagnoses   Final diagnoses:  Muscle pain  Radicular pain    Return for weakness, numbness, changes in vision or speech, fevers >100.4 unrelieved by medication, shortness of breath, intractable vomiting, or diarrhea, abdominal pain, Inability to  tolerate liquids or food, cough, altered mental status or any concerns. No signs of systemic illness or infection. The patient is nontoxic-appearing on exam and vital signs are within normal limits.   I have reviewed the triage vital signs and the nursing notes. Pertinent labs &imaging results that were available during my care of the patient were reviewed by me and considered in my medical decision making (see chart for details).  After history, exam, and medical workup I feel the patient has been appropriately medically screened and is safe for discharge home. Pertinent diagnoses were discussed with the patient. Patient was given return precautions.    ED Discharge Orders        Ordered    naproxen (NAPROSYN) 375 MG tablet  2 times daily     03/17/18 0110    methocarbamol (ROBAXIN) 500 MG tablet  2 times daily     03/17/18 0110       Alane Hanssen, MD 03/17/18 [id number]

## 2018-08-14 ENCOUNTER — Other Ambulatory Visit: Payer: Self-pay

## 2018-08-14 ENCOUNTER — Encounter (HOSPITAL_BASED_OUTPATIENT_CLINIC_OR_DEPARTMENT_OTHER): Payer: Self-pay | Admitting: Emergency Medicine

## 2018-08-14 ENCOUNTER — Emergency Department (HOSPITAL_BASED_OUTPATIENT_CLINIC_OR_DEPARTMENT_OTHER)
Admission: EM | Admit: 2018-08-14 | Discharge: 2018-08-14 | Disposition: A | Discharge status: Home / Self Care | Payer: Self-pay | Attending: Emergency Medicine | Admitting: Emergency Medicine

## 2018-08-14 DIAGNOSIS — F17210 Nicotine dependence, cigarettes, uncomplicated: Secondary | ICD-10-CM | POA: Insufficient documentation

## 2018-08-14 DIAGNOSIS — M25511 Pain in right shoulder: Secondary | ICD-10-CM | POA: Insufficient documentation

## 2018-08-14 MED ORDER — METHOCARBAMOL 500 MG PO TABS: 1000.0000 mg | ORAL_TABLET | Freq: Three times a day (TID) | ORAL | 0 refills | Status: DC | PRN

## 2018-08-14 MED ORDER — IBUPROFEN 800 MG PO TABS: 800.0000 mg | ORAL_TABLET | Freq: Three times a day (TID) | ORAL | 0 refills | Status: DC

## 2018-08-14 MED FILL — METHOCARBAMOL 500 MG TABLET: 500 | 3 days supply | Qty: 20 | Fill #0

## 2018-08-14 MED FILL — IBUPROFEN 800 MG TAB: 800 | 7 days supply | Qty: 21 | Fill #0

## 2018-08-14 NOTE — ED Provider Notes (Signed)
Emergency Department Provider Note   I have reviewed the triage vital signs and the nursing notes.   HISTORY  Chief Complaint Shoulder Pain and Neck Pain   HPI Jocelyn Adams is a 47 y.o. female who is had intermittent and worsening right shoulder neck pain since July.  She has been seen by multiple providers for the same symptoms and gets better with the exercise and medications but then comes back again.  She states that she works at a job where she is using a [employer] all the day with her right hand but she does not think that is related.  She has not ever followed up with her primary doctor nor physical therapy or sports medicine.  This got worse again over the last couple months.  No fevers.  No trauma.  No neck pain.  Pain seems to be in her right trapezius and worse when she moves her neck to the left.  Symptoms seem to gotten better previously with anti-inflammatories and Robaxin. No other associated or modifying symptoms.    History reviewed. No pertinent past medical history.  There are no active problems to display for this patient.   Past Surgical History:  Procedure Laterality Date  . ABDOMINAL HYSTERECTOMY    . UTERINE FIBROID SURGERY      Current Outpatient Rx  . Order #: [id number] Class: Print  . Order #: [id number] Class: Print  . Order #: [id number] Class: Print  . Order #: [id number] Class: Print  . Order #: [id number] Class: Normal    Allergies Patient has no known allergies.  No family history on file.  Social History Social History   Tobacco Use  . Smoking status: Current Every Day Smoker    Packs/day: 0.50    Types: Cigarettes  . Smokeless tobacco: Never Used  Substance Use Topics  . Alcohol use: No  . Drug use: No    Review of Systems  All other systems negative except as documented in the HPI. All pertinent positives and negatives as reviewed in the HPI. ____________________________________________   PHYSICAL EXAM:  VITAL SIGNS: ED  Triage Vitals  Enc Vitals Group     BP 08/14/18 0911 103/77     Pulse Rate 08/14/18 0911 77     Resp 08/14/18 0911 18     Temp 08/14/18 0911 98.3 F (36.8 C)     Temp Source 08/14/18 0911 Oral     SpO2 08/14/18 0911 96 %     Weight 08/14/18 0912 167 lb (75.8 kg)     Height 08/14/18 0912 5\' 1"  (1.549 m)    Constitutional: Alert and oriented. Well appearing and in no acute distress. Eyes: Conjunctivae are normal. PERRL. EOMI. Head: Atraumatic. Nose: No congestion/rhinnorhea. Mouth/Throat: Mucous membranes are moist.  Oropharynx non-erythematous. Neck: No stridor.  No meningeal signs.   Cardiovascular: Normal rate, regular rhythm. Good peripheral circulation. Grossly normal heart sounds.   Respiratory: Normal respiratory effort.  No retractions. Lungs CTAB. Gastrointestinal: Soft and nontender. No distention.  Musculoskeletal: TTP to right trapezius.  Normal distal strength.  No cervical spine tenderness or upper thoracic spine tenderness.  Normal right radial pulse. Neurologic:  Normal speech and language. No gross focal neurologic deficits are appreciated.  Skin:  Skin is warm, dry and intact. No rash noted.   ____________________________________________   INITIAL IMPRESSION / ASSESSMENT AND PLAN / ED COURSE  No indication for imaging at this time.  Seems to be very muscular in nature with less suspicion of vascular or neurologic causes.  Will treat as before and give her shoulder range of motion exercises.  Discussed with her that if she does not start doing these that her symptoms were only to get worse and that she was in a develop adhesive capsulitis which would likely be disabling.  We will ask her to follow-up with sports medicine however the patient does not have insurance nor a lot of money so I do not know if she will actually do this.   Pertinent labs & imaging results that were available during my care of the patient were reviewed by me and considered in my medical  decision making (see chart for details).  ____________________________________________  FINAL CLINICAL IMPRESSION(S) / ED DIAGNOSES  Final diagnoses:  Right shoulder pain, unspecified chronicity     MEDICATIONS GIVEN DURING THIS VISIT:  Medications - No data to display   NEW OUTPATIENT MEDICATIONS STARTED DURING THIS VISIT:  Current Discharge Medication List    START taking these medications   Details  ibuprofen (ADVIL,MOTRIN) 800 MG tablet Take 1 tablet (800 mg total) by mouth 3 (three) times daily. Qty: 21 tablet, Refills: 0        Note:  This note was prepared with assistance of Dragon voice recognition software. Occasional wrong-word or sound-a-like substitutions may have occurred due to the inherent limitations of voice recognition software.   Marily Memos, MD 08/14/18 1005

## 2018-08-14 NOTE — ED Triage Notes (Signed)
Pt c/o RT shoulder and neck pain since July; sts she was seen here for same then; pain has not improved

## 2018-11-11 ENCOUNTER — Other Ambulatory Visit: Payer: Self-pay

## 2018-11-11 ENCOUNTER — Encounter (HOSPITAL_BASED_OUTPATIENT_CLINIC_OR_DEPARTMENT_OTHER): Payer: Self-pay | Admitting: Emergency Medicine

## 2018-11-11 ENCOUNTER — Emergency Department (HOSPITAL_BASED_OUTPATIENT_CLINIC_OR_DEPARTMENT_OTHER): Payer: Self-pay

## 2018-11-11 ENCOUNTER — Emergency Department (HOSPITAL_BASED_OUTPATIENT_CLINIC_OR_DEPARTMENT_OTHER)
Admission: EM | Admit: 2018-11-11 | Discharge: 2018-11-11 | Disposition: A | Discharge status: Home / Self Care | Payer: Self-pay | Attending: Emergency Medicine | Admitting: Emergency Medicine

## 2018-11-11 DIAGNOSIS — Z79899 Other long term (current) drug therapy: Secondary | ICD-10-CM | POA: Insufficient documentation

## 2018-11-11 DIAGNOSIS — F17210 Nicotine dependence, cigarettes, uncomplicated: Secondary | ICD-10-CM | POA: Insufficient documentation

## 2018-11-11 DIAGNOSIS — R0789 Other chest pain: Secondary | ICD-10-CM | POA: Insufficient documentation

## 2018-11-11 DIAGNOSIS — R51 Headache: Secondary | ICD-10-CM | POA: Insufficient documentation

## 2018-11-11 DIAGNOSIS — R519 Headache, unspecified: Secondary | ICD-10-CM

## 2018-11-11 LAB — CBC
HCT: 39.3 % (ref 36.0–46.0)
Hemoglobin: 12.1 g/dL (ref 12.0–15.0)
MCH: 28.7 pg (ref 26.0–34.0)
MCHC: 30.8 g/dL (ref 30.0–36.0)
MCV: 93.3 fL (ref 80.0–100.0)
Platelets: 209 10*3/uL (ref 150–400)
RBC: 4.21 MIL/uL (ref 3.87–5.11)
RDW: 13.1 % (ref 11.5–15.5)
WBC: 10 10*3/uL (ref 4.0–10.5)
nRBC: 0 % (ref 0.0–0.2)

## 2018-11-11 LAB — TROPONIN I
Troponin I: <0.03 ng/mL (ref <0.03)
Troponin I: <0.03 ng/mL (ref <0.03)

## 2018-11-11 LAB — BASIC METABOLIC PANEL
Anion gap: 5 (ref 5–15)
BUN: 18 mg/dL (ref 6–20)
CO2: 28 mmol/L (ref 22–32)
Calcium: 8.7 mg/dL — ABNORMAL LOW (ref 8.9–10.3)
Chloride: 105 mmol/L (ref 98–111)
Creatinine, Ser: 1.02 mg/dL — ABNORMAL HIGH (ref 0.44–1.00)
GFR calc Af Amer: >60 mL/min (ref >60)
GFR calc non Af Amer: >60 mL/min (ref >60)
Glucose, Bld: 91 mg/dL (ref 70–99)
Potassium: 3.4 mmol/L — ABNORMAL LOW (ref 3.5–5.1)
SODIUM: 138 mmol/L (ref 135–145)

## 2018-11-11 LAB — PREGNANCY, URINE: Preg Test, Ur: NEGATIVE

## 2018-11-11 MED ORDER — PROCHLORPERAZINE EDISYLATE 10 MG/2ML IJ SOLN
10.0000 mg | Freq: Once | INTRAMUSCULAR | Status: AC
  Administered 2018-11-11: 10 mg via INTRAVENOUS
  Filled 2018-11-11: qty 2

## 2018-11-11 MED ORDER — DIPHENHYDRAMINE HCL 50 MG/ML IJ SOLN
25.0000 mg | Freq: Once | INTRAMUSCULAR | Status: AC
  Administered 2018-11-11: 25 mg via INTRAVENOUS
  Filled 2018-11-11: qty 1

## 2018-11-11 MED ORDER — SODIUM CHLORIDE 0.9 % IV BOLUS
1000.0000 mL | Freq: Once | INTRAVENOUS | Status: AC
  Administered 2018-11-11: 1000 mL via INTRAVENOUS

## 2018-11-11 MED ORDER — KETOROLAC TROMETHAMINE 30 MG/ML IJ SOLN
30.0000 mg | Freq: Once | INTRAMUSCULAR | Status: AC
  Administered 2018-11-11: 30 mg via INTRAVENOUS
  Filled 2018-11-11: qty 1

## 2018-11-11 NOTE — ED Notes (Signed)
ED Provider at bedside. 

## 2018-11-11 NOTE — ED Provider Notes (Addendum)
[facility] EMERGENCY DEPARTMENT Provider Note   CSN: [id number] Arrival date & time: 11/11/18  0115     History   Chief Complaint Chief Complaint  Patient presents with  . Chest Pain  . Headache  . Dizziness    HPI Jocelyn Adams is a 48 y.o. female.  48yo F w/ PMH below who p/w HA, L arm pain, and chest pain. She reports 1 week of intermittent L sided chest pain that she describes as a soreness. Pain lasts for a few minutes, is not related to exertion, and is associated with feeling like she is going to pass out. Sx seem to be worse at night. She has also had random L arm pain that lasts a few seconds and often occurs later after chest pain episodes. She reports that her chest gets "cloggy." She reports 3 days of persistent headache. No vision problems, weakness, balance problems, fevers, URI sx, or recent illness. No therapies/medications tried PTA.   No recent travel, h/o blood clots, h/o cancer, OCP use, or FH heart disease. No alcohol or drug use. Smokes a few cigarettes per day.   The history is provided by the patient.  Chest Pain  Associated symptoms: dizziness and headache   Headache  Associated symptoms: dizziness   Dizziness  Associated symptoms: chest pain and headaches     History reviewed. No pertinent past medical history.  There are no active problems to display for this patient.   Past Surgical History:  Procedure Laterality Date  . ABDOMINAL HYSTERECTOMY    . UTERINE FIBROID SURGERY       OB History   No obstetric history on file.      Home Medications    Prior to Admission medications   Medication Sig Start Date End Date Taking? Authorizing Provider  benzonatate (TESSALON) 100 MG capsule Take 1 capsule (100 mg total) by mouth every 8 (eight) hours. 12/23/15   Mady GemmaWestfall, Elizabeth C, PA-C  ibuprofen (ADVIL,MOTRIN) 800 MG tablet Take 1 tablet (800 mg total) by mouth 3 (three) times daily. 08/14/18   Mesner, Barbara CowerJason, MD  methocarbamol  (ROBAXIN) 500 MG tablet Take 2 tablets (1,000 mg total) by mouth every 8 (eight) hours as needed for muscle spasms. 08/14/18   Mesner, Barbara CowerJason, MD  naproxen (NAPROSYN) 375 MG tablet Take 1 tablet (375 mg total) by mouth 2 (two) times daily. 03/17/18   Palumbo, April, MD  nicotine polacrilex (NICORETTE) 4 MG gum Take 1 each (4 mg total) by mouth as needed for smoking cessation. 06/08/15   Selina CooleyFlores, Kyle, MD    Family History No family history on file.  Social History Social History   Tobacco Use  . Smoking status: Current Every Day Smoker    Packs/day: 0.50    Types: Cigarettes  . Smokeless tobacco: Never Used  Substance Use Topics  . Alcohol use: No  . Drug use: No     Allergies   Patient has no known allergies.   Review of Systems Review of Systems  Cardiovascular: Positive for chest pain.  Neurological: Positive for dizziness and headaches.   All other systems reviewed and are negative except that which was mentioned in HPI   Physical Exam Updated Vital Signs BP 106/63   Pulse 63   Temp 98 F (36.7 C)   Resp 18   Ht 5\' 1"  (1.549 m)   Wt 75.8 kg   SpO2 99%   BMI 31.55 kg/m   Physical Exam Vitals signs and nursing note  reviewed.  Constitutional:      General: She is not in acute distress.    Appearance: She is well-developed.     Comments: Awake, alert  HENT:     Head: Normocephalic and atraumatic.  Eyes:     Extraocular Movements: Extraocular movements intact.     Conjunctiva/sclera: Conjunctivae normal.     Pupils: Pupils are equal, round, and reactive to light.  Neck:     Musculoskeletal: Neck supple.  Cardiovascular:     Rate and Rhythm: Normal rate and regular rhythm.     Heart sounds: Normal heart sounds. No murmur.  Pulmonary:     Effort: Pulmonary effort is normal. No respiratory distress.     Breath sounds: Normal breath sounds.  Abdominal:     General: Bowel sounds are normal. There is no distension.     Palpations: Abdomen is soft.      Tenderness: There is no abdominal tenderness.  Musculoskeletal:     Right lower leg: No edema.     Left lower leg: No edema.  Skin:    General: Skin is warm and dry.  Neurological:     Mental Status: She is alert and oriented to person, place, and time.     Cranial Nerves: No cranial nerve deficit.     Motor: No abnormal muscle tone.     Deep Tendon Reflexes: Reflexes are normal and symmetric.     Comments: Fluent speech, normal finger-to-nose testing, negative pronator drift, no clonus 5/5 strength and normal sensation x all 4 extremities  Psychiatric:        Thought Content: Thought content normal.        Judgment: Judgment normal.      ED Treatments / Results  Labs (all labs ordered are listed, but only abnormal results are displayed) Labs Reviewed  BASIC METABOLIC PANEL - Abnormal; Notable for the following components:      Result Value   Potassium 3.4 (*)    Creatinine, Ser 1.02 (*)    Calcium 8.7 (*)    All other components within normal limits  CBC  TROPONIN I  PREGNANCY, URINE  TROPONIN I    EKG EKG Interpretation  Date/Time:  Saturday November 11 2018 01:31:00 EST Ventricular Rate:  67 PR Interval:    QRS Duration: 88 QT Interval:  392 QTC Calculation: 414 R Axis:   59 Text Interpretation:  Sinus rhythm Baseline wander in lead(s) V1 No significant change since last tracing Confirmed by Frederick PeersLittle, Yaminah Clayborn [phone]([id number]) on 11/11/2018 1:37:44 AM   Radiology Dg Chest 2 View  Result Date: 11/11/2018 CLINICAL DATA:  Headache, chest pain, shortness of breath EXAM: CHEST - 2 VIEW COMPARISON:  03/16/2018 FINDINGS: Heart and mediastinal contours are within normal limits. No focal opacities or effusions. No acute bony abnormality. IMPRESSION: No active cardiopulmonary disease. Electronically Signed   By: Charlett NoseKevin  Dover M.D.   On: 11/11/2018 01:57    Procedures Procedures (including critical care time)  Medications Ordered in ED Medications  diphenhydrAMINE (BENADRYL)  injection 25 mg (25 mg Intravenous Given 11/11/18 0212)  prochlorperazine (COMPAZINE) injection 10 mg (10 mg Intravenous Given 11/11/18 19140212)  sodium chloride 0.9 % bolus 1,000 mL (0 mLs Intravenous Stopped 11/11/18 0312)  ketorolac (TORADOL) 30 MG/ML injection 30 mg (30 mg Intravenous Given 11/11/18 0215)     Initial Impression / Assessment and Plan / ED Course  I have reviewed the triage vital signs and the nursing notes.  Pertinent labs & imaging results that were available  during my care of the patient were reviewed by me and considered in my medical decision making (see chart for details).    No acute distress on exam, EKG without ischemic changes.  Normal neurologic exam. PERC negative therefore PE very unlikely.  No red flag symptoms to warrant head imaging at this time.  Lab work shows negative serial troponins, reassuring basic labs.  Chest x-ray normal.  Gave migraine cocktail.  On reexamination, patient was resting comfortably.  Discussed work-up results and PCP follow-up. HEART score is <3 and given atypical features w/ reassuring w/u here, I feel she is safe for discharge.  I reviewed return precautions and she voiced understanding.  Final Clinical Impressions(s) / ED Diagnoses   Final diagnoses:  Atypical chest pain  Bad headache    ED Discharge Orders    None       Babita Amaker, Ambrose Finland, MD 11/11/18 0539    Clarene Duke Ambrose Finland, MD 11/27/18 [phone]

## 2018-11-11 NOTE — ED Notes (Signed)
Patient transported to X-ray 

## 2018-11-11 NOTE — ED Notes (Signed)
Pt understood dc material. NAD noted. All questions answered to satisfaction.  

## 2018-11-11 NOTE — ED Triage Notes (Signed)
Pt states that she has had a headache and left arm pain for three days. Tonight she started experiencing chest pain, dizziness, and SHOB. Describes the pain as a "light pain: that stays in the chest

## 2019-04-09 ENCOUNTER — Other Ambulatory Visit: Payer: Self-pay

## 2019-04-09 ENCOUNTER — Emergency Department (HOSPITAL_BASED_OUTPATIENT_CLINIC_OR_DEPARTMENT_OTHER)
Admission: EM | Admit: 2019-04-09 | Discharge: 2019-04-10 | Disposition: A | Discharge status: Home / Self Care | Payer: Self-pay | Attending: Emergency Medicine | Admitting: Emergency Medicine

## 2019-04-09 ENCOUNTER — Encounter (HOSPITAL_BASED_OUTPATIENT_CLINIC_OR_DEPARTMENT_OTHER): Payer: Self-pay | Admitting: *Deleted

## 2019-04-09 DIAGNOSIS — F17210 Nicotine dependence, cigarettes, uncomplicated: Secondary | ICD-10-CM | POA: Insufficient documentation

## 2019-04-09 DIAGNOSIS — L03115 Cellulitis of right lower limb: Secondary | ICD-10-CM | POA: Insufficient documentation

## 2019-04-09 NOTE — ED Triage Notes (Signed)
Pt c/o right outer ankle insect bite x 3 weeks

## 2019-04-10 MED ORDER — CEPHALEXIN 500 MG PO CAPS: 500.0000 mg | ORAL_CAPSULE | Freq: Three times a day (TID) | ORAL | 0 refills | Status: AC

## 2019-04-10 MED ORDER — CEPHALEXIN 250 MG PO CAPS
500.0000 mg | ORAL_CAPSULE | Freq: Once | ORAL | Status: AC
  Administered 2019-04-10: 500 mg via ORAL
  Filled 2019-04-10: qty 2

## 2019-04-10 NOTE — ED Provider Notes (Signed)
[facility] EMERGENCY DEPARTMENT Provider Note  CSN: [id number] Arrival date & time: 04/09/19 2256  Chief Complaint(s) Insect Bite  HPI Jocelyn Adams is a 48 y.o. female   The history is provided by the patient.  Foot Pain This is a new problem. Episode onset: 3 weeks. The problem occurs constantly. The problem has been gradually worsening. Pertinent negatives include no chest pain, no abdominal pain, no headaches and no shortness of breath. The symptoms are aggravated by walking (touching). Nothing relieves the symptoms. She has tried nothing for the symptoms. The treatment provided no relief.   Believes she might have been bit by an insect.  Denies trauma, IVDU, H/o gout.  Past Medical History History reviewed. No pertinent past medical history. There are no active problems to display for this patient.  Home Medication(s) Prior to Admission medications   Medication Sig Start Date End Date Taking? Authorizing Provider  benzonatate (TESSALON) 100 MG capsule Take 1 capsule (100 mg total) by mouth every 8 (eight) hours. 12/23/15   Mady GemmaWestfall, Elizabeth C, PA-C  cephALEXin (KEFLEX) 500 MG capsule Take 1 capsule (500 mg total) by mouth 3 (three) times daily for 7 days. 04/10/19 04/17/19  Nira Connardama, Eylin Pontarelli Eduardo, MD  ibuprofen (ADVIL,MOTRIN) 800 MG tablet Take 1 tablet (800 mg total) by mouth 3 (three) times daily. 08/14/18   Mesner, Barbara CowerJason, MD  methocarbamol (ROBAXIN) 500 MG tablet Take 2 tablets (1,000 mg total) by mouth every 8 (eight) hours as needed for muscle spasms. 08/14/18   Mesner, Barbara CowerJason, MD  naproxen (NAPROSYN) 375 MG tablet Take 1 tablet (375 mg total) by mouth 2 (two) times daily. 03/17/18   Palumbo, April, MD  nicotine polacrilex (NICORETTE) 4 MG gum Take 1 each (4 mg total) by mouth as needed for smoking cessation. 06/08/15   Selina CooleyFlores, Kyle, MD                                                                                                                                     Past Surgical History Past Surgical History:  Procedure Laterality Date  . ABDOMINAL HYSTERECTOMY    . UTERINE FIBROID SURGERY     Family History History reviewed. No pertinent family history.  Social History Social History   Tobacco Use  . Smoking status: Current Every Day Smoker    Packs/day: 0.50    Types: Cigarettes  . Smokeless tobacco: Never Used  Substance Use Topics  . Alcohol use: No  . Drug use: No   Allergies Sulfamethoxazole-trimethoprim  Review of Systems Review of Systems  Respiratory: Negative for shortness of breath.   Cardiovascular: Negative for chest pain.  Gastrointestinal: Negative for abdominal pain.  Neurological: Negative for headaches.   All other systems are reviewed and are negative for acute change except as noted in the HPI  Physical Exam Vital Signs  I have reviewed the triage vital signs BP 129/90   Pulse 92  Temp 97.9 F (36.6 C) (Oral)   Resp 18   Ht 5' 1.25" (1.556 m)   Wt 78.9 kg   SpO2 100%   BMI 32.61 kg/m   Physical Exam Vitals signs reviewed.  Constitutional:      General: She is not in acute distress.    Appearance: She is well-developed. She is not diaphoretic.  HENT:     Head: Normocephalic and atraumatic.     Right Ear: External ear normal.     Left Ear: External ear normal.     Nose: Nose normal.  Eyes:     General: No scleral icterus.    Conjunctiva/sclera: Conjunctivae normal.  Neck:     Musculoskeletal: Normal range of motion.     Trachea: Phonation normal.  Cardiovascular:     Rate and Rhythm: Normal rate and regular rhythm.  Pulmonary:     Effort: Pulmonary effort is normal. No respiratory distress.     Breath sounds: No stridor.  Abdominal:     General: There is no distension.  Musculoskeletal: Normal range of motion.       Feet:  Neurological:     Mental Status: She is alert and oriented to person, place, and time.  Psychiatric:        Behavior: Behavior normal.     ED Results and  Treatments Labs (all labs ordered are listed, but only abnormal results are displayed) Labs Reviewed - No data to display                                                                                                                       EKG  EKG Interpretation  Date/Time:    Ventricular Rate:    PR Interval:    QRS Duration:   QT Interval:    QTC Calculation:   R Axis:     Text Interpretation:        Radiology No results found.  Pertinent labs & imaging results that were available during my care of the patient were reviewed by me and considered in my medical decision making (see chart for details).  Medications Ordered in ED Medications  cephALEXin (KEFLEX) capsule 500 mg (has no administration in time range)                                                                                                                                    Procedures Procedures  (  including critical care time)  Medical Decision Making / ED Course I have reviewed the nursing notes for this encounter and the patient's prior records (if available in EHR or on provided paperwork).   Jocelyn Adams was evaluated in Emergency Department on 04/10/2019 for the symptoms described in the history of present illness. She was evaluated in the context of the global COVID-19 pandemic, which necessitated consideration that the patient might be at risk for infection with the SARS-CoV-2 virus that causes COVID-19. Institutional protocols and algorithms that pertain to the evaluation of patients at risk for COVID-19 are in a state of rapid change based on information released by regulatory bodies including the CDC and federal and state organizations. These policies and algorithms were followed during the patient's care in the ED.  Most suspicious for cellulitis. Doubt gout, septic arthritis, DVT. Will treat with oral Abx.  The patient appears reasonably screened and/or stabilized for discharge and I doubt any  other medical condition or other [facility] requiring further screening, evaluation, or treatment in the ED at this time prior to discharge.  The patient is safe for discharge with strict return precautions.       Final Clinical Impression(s) / ED Diagnoses Final diagnoses:  Cellulitis of right lower extremity     The patient appears reasonably screened and/or stabilized for discharge and I doubt any other medical condition or other [facility] requiring further screening, evaluation, or treatment in the ED at this time prior to discharge.  Disposition: Discharge  Condition: Good  I have discussed the results, Dx and Tx plan with the patient who expressed understanding and agree(s) with the plan. Discharge instructions discussed at great length. The patient was given strict return precautions who verbalized understanding of the instructions. No further questions at time of discharge.    ED Discharge Orders         Ordered    cephALEXin (KEFLEX) 500 MG capsule  3 times daily     04/10/19 0029            Follow Up: Primary care provider  Schedule an appointment as soon as possible for a visit  in 5-7 days, If symptoms do not improve or  worsen     This chart was dictated using voice recognition software.  Despite best efforts to proofread,  errors can occur which can change the documentation meaning.   Fatima Blank, MD 04/10/19 0030

## 2019-04-10 NOTE — ED Notes (Signed)
ED Provider at bedside. 

## 2019-07-19 ENCOUNTER — Emergency Department (HOSPITAL_COMMUNITY)
Admission: EM | Admit: 2019-07-19 | Discharge: 2019-07-19 | Disposition: A | Discharge status: Home / Self Care | Payer: BLUE CROSS/BLUE SHIELD | Attending: Emergency Medicine | Admitting: Emergency Medicine

## 2019-07-19 DIAGNOSIS — K047 Periapical abscess without sinus: Secondary | ICD-10-CM | POA: Diagnosis not present

## 2019-07-19 DIAGNOSIS — F17210 Nicotine dependence, cigarettes, uncomplicated: Secondary | ICD-10-CM | POA: Insufficient documentation

## 2019-07-19 DIAGNOSIS — K0889 Other specified disorders of teeth and supporting structures: Secondary | ICD-10-CM | POA: Diagnosis present

## 2019-07-19 DIAGNOSIS — K029 Dental caries, unspecified: Secondary | ICD-10-CM | POA: Insufficient documentation

## 2019-07-19 MED ORDER — OXYCODONE-ACETAMINOPHEN 5-325 MG PO TABS
1.0000 | ORAL_TABLET | ORAL | Status: DC | PRN
  Administered 2019-07-19: 1 via ORAL
  Filled 2019-07-19 (×2): qty 1

## 2019-07-19 MED ORDER — CLINDAMYCIN HCL 150 MG PO CAPS: 150.0000 mg | ORAL_CAPSULE | Freq: Four times a day (QID) | ORAL | 0 refills | Status: DC

## 2019-07-19 MED ORDER — OXYCODONE-ACETAMINOPHEN 5-325 MG PO TABS: 1.0000 | ORAL_TABLET | Freq: Four times a day (QID) | ORAL | 0 refills | Status: DC | PRN

## 2019-07-19 NOTE — Discharge Instructions (Signed)
Please follow up closely with your dentist for further management of your dental infection.

## 2019-07-19 NOTE — ED Triage Notes (Signed)
Pt reports dental pain with abscess for 2 weeks has been seen at med center per pt and was seen at dentist today. Dentist did numb pt today but was unable to extract tooth due to swelling pt is currently on amoxacillin pt came to ED due to pain.

## 2019-07-19 NOTE — ED Notes (Signed)
Patient verbalizes understanding of discharge instructions. Opportunity for questioning and answers were provided. Armband removed by staff, pt discharged from ED ambulatory by w/ friend

## 2019-07-19 NOTE — ED Provider Notes (Signed)
[facility] EMERGENCY DEPARTMENT Provider Note   CSN: [id number] Arrival date & time: 07/19/19  1527     History   Chief Complaint Chief Complaint  Patient presents with  . Dental Pain    HPI Jocelyn Adams is a 48 y.o. female.     The history is provided by the patient and medical records. No language interpreter was used.  Dental Pain Associated symptoms: facial swelling   Associated symptoms: no fever and no headaches      48 year old female presenting for evaluation of dental pain.  Patient report for the past 2 weeks she has had progressive worsening pain to her right lower molar.  Pain is sharp throbbing persistent and for the past few days she also endorsed increasing facial swelling. This is patient's fourth ER visit in the past 2 weeks for dental pain.  She report receiving multitudes of medication which includes tramadol, viscous lidocaine, amoxicillin, ibuprofen without adequate relief.  She also mention been seen by dentist today, receiving a dental block but was told that her teeth cannot be removed until swelling decreased.  She report pain is become more severe stopping her from sleeping and decided to come here.  She does not complain of fever, neck pain, trouble swallowing.  No past medical history on file.  There are no active problems to display for this patient.   Past Surgical History:  Procedure Laterality Date  . ABDOMINAL HYSTERECTOMY    . UTERINE FIBROID SURGERY       OB History   No obstetric history on file.      Home Medications    Prior to Admission medications   Medication Sig Start Date End Date Taking? Authorizing Provider  benzonatate (TESSALON) 100 MG capsule Take 1 capsule (100 mg total) by mouth every 8 (eight) hours. 12/23/15   Mady Gemma, PA-C  ibuprofen (ADVIL,MOTRIN) 800 MG tablet Take 1 tablet (800 mg total) by mouth 3 (three) times daily. 08/14/18   Mesner, Barbara Cower, MD  methocarbamol (ROBAXIN)  500 MG tablet Take 2 tablets (1,000 mg total) by mouth every 8 (eight) hours as needed for muscle spasms. 08/14/18   Mesner, Barbara Cower, MD  naproxen (NAPROSYN) 375 MG tablet Take 1 tablet (375 mg total) by mouth 2 (two) times daily. 03/17/18   Palumbo, April, MD  nicotine polacrilex (NICORETTE) 4 MG gum Take 1 each (4 mg total) by mouth as needed for smoking cessation. 06/08/15   Selina Cooley, MD    Family History No family history on file.  Social History Social History   Tobacco Use  . Smoking status: Current Every Day Smoker    Packs/day: 0.50    Types: Cigarettes  . Smokeless tobacco: Never Used  Substance Use Topics  . Alcohol use: No  . Drug use: No     Allergies   Sulfamethoxazole-trimethoprim   Review of Systems Review of Systems  Constitutional: Negative for fever.  HENT: Positive for dental problem and facial swelling.   Neurological: Negative for headaches.     Physical Exam Updated Vital Signs BP 138/72 (BP Location: Left Arm)   Pulse 78   Temp 98.8 F (37.1 C) (Oral)   Resp 18   SpO2 100%   Physical Exam Vitals signs and nursing note reviewed.  Constitutional:      General: She is not in acute distress.    Appearance: She is well-developed.  HENT:     Head: Atraumatic.     Mouth/Throat:  Comments: Tenderness to tooth #30 without significant dental decay however there adjacent facial swelling noted.  No trismus.  No abscess amenable for drainage. Eyes:     Conjunctiva/sclera: Conjunctivae normal.  Neck:     Musculoskeletal: Neck supple.  Skin:    Findings: No rash.  Neurological:     Mental Status: She is alert.      ED Treatments / Results  Labs (all labs ordered are listed, but only abnormal results are displayed) Labs Reviewed - No data to display  EKG None  Radiology No results found.  Procedures Procedures (including critical care time)  Medications Ordered in ED Medications  oxyCODONE-acetaminophen (PERCOCET/ROXICET) 5-325  MG per tablet 1 tablet (has no administration in time range)     Initial Impression / Assessment and Plan / ED Course  I have reviewed the triage vital signs and the nursing notes.  Pertinent labs & imaging results that were available during my care of the patient were reviewed by me and considered in my medical decision making (see chart for details).        BP 138/72 (BP Location: Left Arm)   Pulse 78   Temp 98.8 F (37.1 C) (Oral)   Resp 18   SpO2 100%    Final Clinical Impressions(s) / ED Diagnoses   Final diagnoses:  Periapical abscess with facial involvement    ED Discharge Orders         Ordered    oxyCODONE-acetaminophen (PERCOCET) 5-325 MG tablet  Every 6 hours PRN     07/19/19 1807    clindamycin (CLEOCIN) 150 MG capsule  Every 6 hours     07/19/19 1807         5:53 PM Patient here with dental pain consistent with dental abscess with facial involvement.  It has been an ongoing issue and not adequately controlled despite her current treatment.  Patient reported she has also received steroid and has seen by a dentist.  Plan to prescribe clindamycin and have patient return and follow-up with dentist for further care.  Will provide a short course of pain medication.   Domenic Moras, PA-C 07/19/19 Evalee Jefferson    Sherwood Gambler, MD 07/19/19 [phone]

## 2019-07-20 MED FILL — CLINDAMYCIN HCL 150 MG CAPS: 150 | 7 days supply | Qty: 28 | Fill #0

## 2019-07-20 MED FILL — OXYCODONE-ACETAMINOPHEN 5-3: 5-325 | 2 days supply | Qty: 5 | Fill #0

## 2020-02-17 ENCOUNTER — Encounter (HOSPITAL_COMMUNITY): Payer: Self-pay

## 2020-02-17 ENCOUNTER — Ambulatory Visit (HOSPITAL_COMMUNITY)
Admission: EM | Admit: 2020-02-17 | Discharge: 2020-02-17 | Disposition: A | Discharge status: Home / Self Care | Payer: BLUE CROSS/BLUE SHIELD | Attending: Family Medicine | Admitting: Family Medicine

## 2020-02-17 ENCOUNTER — Other Ambulatory Visit: Payer: Self-pay

## 2020-02-17 DIAGNOSIS — R42 Dizziness and giddiness: Secondary | ICD-10-CM

## 2020-02-17 DIAGNOSIS — H9203 Otalgia, bilateral: Secondary | ICD-10-CM

## 2020-02-17 DIAGNOSIS — H938X3 Other specified disorders of ear, bilateral: Secondary | ICD-10-CM | POA: Diagnosis not present

## 2020-02-17 LAB — CBG MONITORING, ED: Glucose-Capillary: 86 mg/dL (ref 70–99)

## 2020-02-17 MED ORDER — MECLIZINE HCL 25 MG PO TABS: 25.0000 mg | ORAL_TABLET | Freq: Three times a day (TID) | ORAL | 0 refills | Status: DC | PRN

## 2020-02-17 NOTE — Discharge Instructions (Signed)
I have sent in some Meclizine for you to take three times daily as needed for dizziness.  Your EKG was normal.  Your blood sugar was normal today too.  Follow up as needed.

## 2020-02-17 NOTE — ED Triage Notes (Signed)
Pt present dizziness and pain in both ears, symptoms started today.  Pt states it feel likes her head is spinning

## 2020-08-09 ENCOUNTER — Other Ambulatory Visit: Payer: Self-pay

## 2020-08-09 ENCOUNTER — Encounter (HOSPITAL_BASED_OUTPATIENT_CLINIC_OR_DEPARTMENT_OTHER): Payer: Self-pay

## 2020-08-09 ENCOUNTER — Emergency Department (HOSPITAL_BASED_OUTPATIENT_CLINIC_OR_DEPARTMENT_OTHER)
Admission: EM | Admit: 2020-08-09 | Discharge: 2020-08-09 | Disposition: A | Discharge status: Home / Self Care | Payer: BLUE CROSS/BLUE SHIELD | Attending: Emergency Medicine | Admitting: Emergency Medicine

## 2020-08-09 DIAGNOSIS — R195 Other fecal abnormalities: Secondary | ICD-10-CM | POA: Diagnosis present

## 2020-08-09 DIAGNOSIS — K625 Hemorrhage of anus and rectum: Secondary | ICD-10-CM | POA: Diagnosis not present

## 2020-08-09 DIAGNOSIS — F17210 Nicotine dependence, cigarettes, uncomplicated: Secondary | ICD-10-CM | POA: Diagnosis not present

## 2020-08-09 LAB — CBC WITH DIFFERENTIAL/PLATELET
Abs Immature Granulocytes: 0.02 10*3/uL (ref 0.00–0.07)
Basophils Absolute: 0 10*3/uL (ref 0.0–0.1)
Basophils Relative: 1 %
Eosinophils Absolute: 0.1 10*3/uL (ref 0.0–0.5)
Eosinophils Relative: 2 %
HCT: 43.1 % (ref 36.0–46.0)
Hemoglobin: 13.5 g/dL (ref 12.0–15.0)
Immature Granulocytes: 0 %
Lymphocytes Relative: 26 %
Lymphs Abs: 1.6 10*3/uL (ref 0.7–4.0)
MCH: 29.2 pg (ref 26.0–34.0)
MCHC: 31.3 g/dL (ref 30.0–36.0)
MCV: 93.3 fL (ref 80.0–100.0)
Monocytes Absolute: 0.3 10*3/uL (ref 0.1–1.0)
Monocytes Relative: 5 %
Neutro Abs: 4 10*3/uL (ref 1.7–7.7)
Neutrophils Relative %: 66 %
Platelets: 208 10*3/uL (ref 150–400)
RBC: 4.62 MIL/uL (ref 3.87–5.11)
RDW: 13.9 % (ref 11.5–15.5)
WBC: 6 10*3/uL (ref 4.0–10.5)
nRBC: 0 % (ref 0.0–0.2)

## 2020-08-09 LAB — BASIC METABOLIC PANEL
Anion gap: 10 (ref 5–15)
BUN: 15 mg/dL (ref 6–20)
CO2: 29 mmol/L (ref 22–32)
Calcium: 9.4 mg/dL (ref 8.9–10.3)
Chloride: 102 mmol/L (ref 98–111)
Creatinine, Ser: 0.69 mg/dL (ref 0.44–1.00)
GFR, Estimated: >60 mL/min (ref >60)
Glucose, Bld: 99 mg/dL (ref 70–99)
Potassium: 4.1 mmol/L (ref 3.5–5.1)
Sodium: 141 mmol/L (ref 135–145)

## 2020-08-09 LAB — OCCULT BLOOD X 1 CARD TO LAB, STOOL: Fecal Occult Bld: POSITIVE — AB

## 2020-08-09 NOTE — ED Provider Notes (Signed)
[facility] EMERGENCY DEPARTMENT Provider Note  CSN: [id number] Arrival date & time: 08/09/20 0730    History Chief Complaint  Patient presents with  . Rectal Bleeding    HPI  Jocelyn Adams is a 49 y.o. female with no significant PMH reports she noticed a streak of black in an otherwise normal brown stool this morning. She also reports she saw a small amount of blood on tissue when she wiped. She has not had any recent abdominal discomfort, cramping, nausea or vomiting. No diarrhea. She ate some pizza with mushrooms and green peppers last night. She reports she has had some fatigue lately, but not other specific symptoms. She is not taking any medications including OTC NSAIDs.    History reviewed. No pertinent past medical history.  Past Surgical History:  Procedure Laterality Date  . ABDOMINAL HYSTERECTOMY    . UTERINE FIBROID SURGERY      History reviewed. No pertinent family history.  Social History   Tobacco Use  . Smoking status: Current Every Day Smoker    Packs/day: 0.50    Types: Cigarettes  . Smokeless tobacco: Never Used  Vaping Use  . Vaping Use: Never used  Substance Use Topics  . Alcohol use: No  . Drug use: No     Home Medications Prior to Admission medications   Not on File     Allergies    Sulfamethoxazole-trimethoprim   Review of Systems   Review of Systems A comprehensive review of systems was completed and negative except as noted in HPI.    Physical Exam BP 118/74 (BP Location: Right Arm)   Pulse 75   Temp 97.9 F (36.6 C) (Oral)   Resp 16   Ht 5\' 1"  (1.549 m)   Wt 74.8 kg   SpO2 99%   BMI 31.18 kg/m   Physical Exam Vitals and nursing note reviewed.  Constitutional:      Appearance: Normal appearance.  HENT:     Head: Normocephalic and atraumatic.     Nose: Nose normal.     Mouth/Throat:     Mouth: Mucous membranes are moist.  Eyes:     Extraocular Movements: Extraocular movements intact.      Conjunctiva/sclera: Conjunctivae normal.  Cardiovascular:     Rate and Rhythm: Normal rate.  Pulmonary:     Effort: Pulmonary effort is normal.     Breath sounds: Normal breath sounds.  Abdominal:     General: Abdomen is flat.     Palpations: Abdomen is soft.     Tenderness: There is no abdominal tenderness.  Genitourinary:    Comments: Rectal Exam: Chaperone present. Small non thrombosed hemorrhoid, no tenderness or mass. Scant brown stool in rectum.  Musculoskeletal:        General: No swelling. Normal range of motion.     Cervical back: Neck supple.  Skin:    General: Skin is warm and dry.  Neurological:     General: No focal deficit present.     Mental Status: She is alert.  Psychiatric:        Mood and Affect: Mood normal.      ED Results / Procedures / Treatments   Labs (all labs ordered are listed, but only abnormal results are displayed) Labs Reviewed  OCCULT BLOOD X 1 CARD TO LAB, STOOL - Abnormal; Notable for the following components:      Result Value   Fecal Occult Bld POSITIVE (*)    All other components within normal  limits  CBC WITH DIFFERENTIAL/PLATELET  BASIC METABOLIC PANEL    EKG None   Radiology No results found.  Procedures Procedures  Medications Ordered in the ED Medications - No data to display   MDM Rules/Calculators/A&P MDM  Patient showed me pictures of her stool this morning. Not melena, small streak of black which may have been a partially digested pepper or mushroom from dinner. Will check hemoccult to confirm.   ED Course  I have reviewed the triage vital signs and the nursing notes.  Pertinent labs & imaging results that were available during my care of the patient were reviewed by me and considered in my medical decision making (see chart for details).  Clinical Course as of Aug 09 856  Sat Aug 09, 2020  5176 Hemoccult was positive. Will check basic labs.    [CS]  0839 CBC is normal.    [CS]  0848 BMP is normal.     [CS]  [id number] Patient with heme positive stool of unclear significance. She showed pictures of small black stool mixed with brown, definitely not melena and no further stools this morning. Does not appear to have any other symptoms to suggest an UGI bleed. Labs and vitals are normal. Recommend close outpatient follow up. RTED if stool becomes all black or if she has any other concerns.    [CS]    Clinical Course User Index [CS] Pollyann Savoy, MD    Final Clinical Impression(s) / ED Diagnoses Final diagnoses:  Rectal bleeding    Rx / DC Orders ED Discharge Orders    None       Pollyann Savoy, MD 08/09/20 [phone]

## 2020-08-09 NOTE — ED Notes (Signed)
Provided chaperone for MD for rectal exam, pt tolerated well, stool for occult blood sent to lab

## 2020-08-09 NOTE — ED Triage Notes (Addendum)
Pt states saw dark stool this morning, came in for evaluation.  Also states has been feeling weak and fatigued for past 2 weeks. Denies abdominal pain at this time

## 2020-08-09 NOTE — ED Notes (Signed)
ED Provider at bedside. 

## 2020-09-07 ENCOUNTER — Emergency Department (HOSPITAL_BASED_OUTPATIENT_CLINIC_OR_DEPARTMENT_OTHER)
Admission: EM | Admit: 2020-09-07 | Discharge: 2020-09-07 | Disposition: A | Discharge status: Home / Self Care | Payer: BLUE CROSS/BLUE SHIELD | Attending: Emergency Medicine | Admitting: Emergency Medicine

## 2020-09-07 ENCOUNTER — Emergency Department (HOSPITAL_BASED_OUTPATIENT_CLINIC_OR_DEPARTMENT_OTHER): Payer: BLUE CROSS/BLUE SHIELD

## 2020-09-07 ENCOUNTER — Other Ambulatory Visit: Payer: Self-pay

## 2020-09-07 ENCOUNTER — Encounter (HOSPITAL_BASED_OUTPATIENT_CLINIC_OR_DEPARTMENT_OTHER): Payer: Self-pay | Admitting: Emergency Medicine

## 2020-09-07 DIAGNOSIS — R079 Chest pain, unspecified: Secondary | ICD-10-CM | POA: Diagnosis present

## 2020-09-07 DIAGNOSIS — Z5321 Procedure and treatment not carried out due to patient leaving prior to being seen by health care provider: Secondary | ICD-10-CM | POA: Insufficient documentation

## 2020-09-07 LAB — TROPONIN I (HIGH SENSITIVITY): Troponin I (High Sensitivity): <2 ng/L (ref <18)

## 2020-09-07 LAB — CBC
HCT: 42.2 % (ref 36.0–46.0)
Hemoglobin: 13.6 g/dL (ref 12.0–15.0)
MCH: 29.5 pg (ref 26.0–34.0)
MCHC: 32.2 g/dL (ref 30.0–36.0)
MCV: 91.5 fL (ref 80.0–100.0)
Platelets: 210 10*3/uL (ref 150–400)
RBC: 4.61 MIL/uL (ref 3.87–5.11)
RDW: 13.3 % (ref 11.5–15.5)
WBC: 8 10*3/uL (ref 4.0–10.5)
nRBC: 0 % (ref 0.0–0.2)

## 2020-09-07 LAB — BASIC METABOLIC PANEL
Anion gap: 4 — ABNORMAL LOW (ref 5–15)
BUN: 18 mg/dL (ref 6–20)
CO2: 31 mmol/L (ref 22–32)
Calcium: 9.3 mg/dL (ref 8.9–10.3)
Chloride: 104 mmol/L (ref 98–111)
Creatinine, Ser: 0.73 mg/dL (ref 0.44–1.00)
GFR, Estimated: >60 mL/min (ref >60)
Glucose, Bld: 100 mg/dL — ABNORMAL HIGH (ref 70–99)
Potassium: 3.8 mmol/L (ref 3.5–5.1)
Sodium: 139 mmol/L (ref 135–145)

## 2020-09-07 NOTE — ED Notes (Signed)
Pt called for room, no answer. Registration reports pt left department

## 2020-09-07 NOTE — ED Triage Notes (Signed)
Reports left sided chest pain that radiates into the left arm for the last week.  Reports it is worse today.  Denies any SOB or n/v.

## 2021-01-23 ENCOUNTER — Other Ambulatory Visit (HOSPITAL_BASED_OUTPATIENT_CLINIC_OR_DEPARTMENT_OTHER): Payer: Self-pay

## 2021-01-23 ENCOUNTER — Other Ambulatory Visit: Payer: Self-pay

## 2021-01-23 ENCOUNTER — Emergency Department (HOSPITAL_BASED_OUTPATIENT_CLINIC_OR_DEPARTMENT_OTHER)
Admission: EM | Admit: 2021-01-23 | Discharge: 2021-01-23 | Disposition: A | Discharge status: Home / Self Care | Payer: BLUE CROSS/BLUE SHIELD | Attending: Emergency Medicine | Admitting: Emergency Medicine

## 2021-01-23 DIAGNOSIS — N898 Other specified noninflammatory disorders of vagina: Secondary | ICD-10-CM | POA: Insufficient documentation

## 2021-01-23 DIAGNOSIS — F17210 Nicotine dependence, cigarettes, uncomplicated: Secondary | ICD-10-CM | POA: Insufficient documentation

## 2021-01-23 MED ORDER — VALACYCLOVIR HCL 1 G PO TABS
1000.0000 mg | ORAL_TABLET | Freq: Two times a day (BID) | ORAL | 0 refills | Status: AC
  Filled 2021-01-23: qty 20, 10d supply, fill #0

## 2021-01-23 NOTE — ED Provider Notes (Signed)
[facility] EMERGENCY DEPARTMENT Provider Note   CSN: [id number] Arrival date & time: 01/23/21  0800     History Chief Complaint  Patient presents with  . ingrown hair    Ingrown hair - "bumps by my vagina"    Jocelyn Adams is a 50 y.o. female.  HPI   Patient presents to the ED for evaluation of some skin irritation in the vaginal area.  Patient states she had waxing pubic hair over a month ago.  Patient states in the last several weeks to month she has noticed sores in the vaginal area more towards the inner labia.  Patient states they are not very painful.  Not itchy.  Patient has been told she has had ingrown hairs in the past.  Patient states 1 time he had to open it up.  No past medical history on file.  There are no problems to display for this patient.   Past Surgical History:  Procedure Laterality Date  . ABDOMINAL HYSTERECTOMY    . UTERINE FIBROID SURGERY       OB History   No obstetric history on file.     No family history on file.  Social History   Tobacco Use  . Smoking status: Current Every Day Smoker    Packs/day: 0.50    Types: Cigarettes  . Smokeless tobacco: Never Used  Vaping Use  . Vaping Use: Never used  Substance Use Topics  . Alcohol use: No  . Drug use: No    Home Medications Prior to Admission medications   Medication Sig Start Date End Date Taking? Authorizing Provider  valACYclovir (VALTREX) 1000 MG tablet Take 1 tablet (1,000 mg total) by mouth 2 (two) times daily for 10 days. 01/23/21 02/02/21 Yes Linwood Dibbles, MD    Allergies    Sulfamethoxazole-trimethoprim  Review of Systems   Review of Systems  All other systems reviewed and are negative.   Physical Exam Updated Vital Signs BP 138/83 (BP Location: Right Arm)   Temp 98.3 F (36.8 C) (Oral)   Resp 16   Ht 1.549 m (5\' 1" )   Wt 74.8 kg   SpO2 100%   BMI 31.18 kg/m   Physical Exam Vitals and nursing note reviewed.  Constitutional:      General: She  is not in acute distress.    Appearance: She is well-developed.  HENT:     Head: Normocephalic and atraumatic.     Right Ear: External ear normal.     Left Ear: External ear normal.  Eyes:     General: No scleral icterus.       Right eye: No discharge.        Left eye: No discharge.     Conjunctiva/sclera: Conjunctivae normal.  Neck:     Trachea: No tracheal deviation.  Cardiovascular:     Rate and Rhythm: Normal rate.  Pulmonary:     Effort: Pulmonary effort is normal. No respiratory distress.     Breath sounds: No stridor.  Abdominal:     General: There is no distension.  Genitourinary:    Labia:        Right: Lesion present.      Comments: Few small ulcerative lesions, approximately 2 mm in size, round at the junction of the squamous mucosal area of the labia, no papules or pustules noted Musculoskeletal:        General: No swelling or deformity.     Cervical back: Neck supple.  Skin:  General: Skin is warm and dry.     Findings: No rash.  Neurological:     Mental Status: She is alert.     Cranial Nerves: Cranial nerve deficit: no gross deficits.     ED Results / Procedures / Treatments   Labs (all labs ordered are listed, but only abnormal results are displayed) Labs Reviewed  HERPES SIMPLEX VIRUS(HSV) DNA BY PCR    EKG None  Radiology No results found.  Procedures Procedures   Medications Ordered in ED Medications - No data to display  ED Course  I have reviewed the triage vital signs and the nursing notes.  Pertinent labs & imaging results that were available during my care of the patient were reviewed by me and considered in my medical decision making (see chart for details).    MDM Rules/Calculators/A&P                          Patient concerned about possible ingrown hairs.  There is a possibility of these have been unroofed by MD also concerned about the possibility of herpes virus.  We will send off a culture.  Patient has never been  diagnosed with herpes before.  Recommend follow-up with her OB/GYN doctor Final Clinical Impression(s) / ED Diagnoses Final diagnoses:  Vaginal lesion    Rx / DC Orders ED Discharge Orders         Ordered    valACYclovir (VALTREX) 1000 MG tablet  2 times daily        01/23/21 6387           Linwood Dibbles, MD 01/23/21 [phone]

## 2021-01-23 NOTE — ED Notes (Signed)
Pt c/o bumps in pelvic region from having hair removed via waxing  MD obtained labia swab in my presence

## 2021-01-23 NOTE — Discharge Instructions (Addendum)
Take the medications as prescribed for possible genital herpes.  The test results should be available in the next couple of days.  Follow-up with your OB/GYN doctor to be rechecked

## 2021-01-23 NOTE — ED Notes (Signed)
Spent 10+ minutes explaining peri care and discharge instructions r/t herpes Advised to sign up for MY Chart to receive results faster

## 2021-01-30 ENCOUNTER — Other Ambulatory Visit (HOSPITAL_BASED_OUTPATIENT_CLINIC_OR_DEPARTMENT_OTHER): Payer: Self-pay

## 2021-01-30 LAB — HSV DNA BY PCR (REFERENCE LAB)
HSV 1 DNA: NEGATIVE
HSV 2 DNA: NEGATIVE

## 2021-07-31 ENCOUNTER — Encounter (HOSPITAL_BASED_OUTPATIENT_CLINIC_OR_DEPARTMENT_OTHER): Payer: Self-pay | Admitting: Emergency Medicine

## 2021-07-31 ENCOUNTER — Other Ambulatory Visit (HOSPITAL_BASED_OUTPATIENT_CLINIC_OR_DEPARTMENT_OTHER): Payer: Self-pay

## 2021-07-31 ENCOUNTER — Emergency Department (HOSPITAL_BASED_OUTPATIENT_CLINIC_OR_DEPARTMENT_OTHER): Payer: BLUE CROSS/BLUE SHIELD

## 2021-07-31 ENCOUNTER — Other Ambulatory Visit: Payer: Self-pay

## 2021-07-31 ENCOUNTER — Emergency Department (HOSPITAL_BASED_OUTPATIENT_CLINIC_OR_DEPARTMENT_OTHER)
Admission: EM | Admit: 2021-07-31 | Discharge: 2021-07-31 | Disposition: A | Discharge status: Home / Self Care | Payer: BLUE CROSS/BLUE SHIELD | Attending: Emergency Medicine | Admitting: Emergency Medicine

## 2021-07-31 DIAGNOSIS — F17210 Nicotine dependence, cigarettes, uncomplicated: Secondary | ICD-10-CM | POA: Diagnosis not present

## 2021-07-31 DIAGNOSIS — R0789 Other chest pain: Secondary | ICD-10-CM | POA: Diagnosis present

## 2021-07-31 DIAGNOSIS — M940 Chondrocostal junction syndrome [Tietze]: Secondary | ICD-10-CM | POA: Diagnosis not present

## 2021-07-31 LAB — BASIC METABOLIC PANEL
Anion gap: 7 (ref 5–15)
BUN: 17 mg/dL (ref 6–20)
CO2: 26 mmol/L (ref 22–32)
Calcium: 8.9 mg/dL (ref 8.9–10.3)
Chloride: 105 mmol/L (ref 98–111)
Creatinine, Ser: 0.73 mg/dL (ref 0.44–1.00)
GFR, Estimated: >60 mL/min (ref >60)
Glucose, Bld: 97 mg/dL (ref 70–99)
Potassium: 3.8 mmol/L (ref 3.5–5.1)
Sodium: 138 mmol/L (ref 135–145)

## 2021-07-31 LAB — D-DIMER, QUANTITATIVE: D-Dimer, Quant: 0.4 ug/mL-FEU (ref 0.00–0.50)

## 2021-07-31 LAB — TROPONIN I (HIGH SENSITIVITY): Troponin I (High Sensitivity): <2 ng/L (ref <18)

## 2021-07-31 LAB — CBC
HCT: 42.9 % (ref 36.0–46.0)
Hemoglobin: 13.7 g/dL (ref 12.0–15.0)
MCH: 29.5 pg (ref 26.0–34.0)
MCHC: 31.9 g/dL (ref 30.0–36.0)
MCV: 92.3 fL (ref 80.0–100.0)
Platelets: 184 10*3/uL (ref 150–400)
RBC: 4.65 MIL/uL (ref 3.87–5.11)
RDW: 13.2 % (ref 11.5–15.5)
WBC: 6.6 10*3/uL (ref 4.0–10.5)
nRBC: 0 % (ref 0.0–0.2)

## 2021-07-31 LAB — HEPATIC FUNCTION PANEL
ALT: 12 U/L (ref 0–44)
AST: 14 U/L — ABNORMAL LOW (ref 15–41)
Albumin: 3.8 g/dL (ref 3.5–5.0)
Alkaline Phosphatase: 77 U/L (ref 38–126)
Bilirubin, Direct: <0.1 mg/dL (ref 0.0–0.2)
Total Bilirubin: 0.4 mg/dL (ref 0.3–1.2)
Total Protein: 7.1 g/dL (ref 6.5–8.1)

## 2021-07-31 LAB — LIPASE, BLOOD: Lipase: 26 U/L (ref 11–51)

## 2021-07-31 MED ORDER — ONDANSETRON HCL 4 MG/2ML IJ SOLN
4.0000 mg | Freq: Once | INTRAMUSCULAR | Status: AC
  Administered 2021-07-31: 4 mg via INTRAVENOUS
  Filled 2021-07-31: qty 2

## 2021-07-31 MED ORDER — NAPROXEN 375 MG PO TABS
375.0000 mg | ORAL_TABLET | Freq: Two times a day (BID) | ORAL | 0 refills | Status: AC
  Filled 2021-07-31: qty 20, 10d supply, fill #0

## 2021-07-31 MED ORDER — MORPHINE SULFATE (PF) 4 MG/ML IV SOLN
4.0000 mg | Freq: Once | INTRAVENOUS | Status: AC
  Administered 2021-07-31: 4 mg via INTRAVENOUS
  Filled 2021-07-31: qty 1

## 2021-07-31 NOTE — ED Notes (Signed)
Spoke with Okey Regal in lab to add on Hepatic panel and lipase

## 2021-07-31 NOTE — ED Notes (Signed)
Pt watching tv, reports pain "feels better".

## 2021-07-31 NOTE — ED Notes (Signed)
Patient transported to X-ray 

## 2021-07-31 NOTE — Discharge Instructions (Signed)
Take the medications as prescribed to help with the pain and inflammation.  Follow-up with your doctor to be rechecked if the symptoms do not resolve in the next week or so.  Return as needed for worsening symptoms

## 2021-07-31 NOTE — ED Notes (Signed)
Pt aware urine specimen ordered. Pt reports inability to provide specimen at this time. Specimen collection device provided to patient. 

## 2021-07-31 NOTE — ED Notes (Signed)
Pt discharged to home. Discharge instructions have been discussed with patient and/or family members. Pt verbally acknowledges understanding d/c instructions, and endorses comprehension to checkout at registration before leaving.  °

## 2021-07-31 NOTE — ED Notes (Addendum)
Pt provided tv remote as per pt request. Pt instructed to use call bell before getting out of bed since pain medication has been administered. Pt acknowledges understanding

## 2021-07-31 NOTE — ED Triage Notes (Addendum)
Pt state chest pain starting 10/27 woke up and wasn't better. Central upper, no known injury, hurts worse with deep breath.

## 2021-07-31 NOTE — ED Notes (Signed)
Pt transported to x ray, will obtain vitals when pt returns.

## 2021-07-31 NOTE — ED Provider Notes (Signed)
[facility] EMERGENCY DEPARTMENT Provider Note   CSN: [id number] Arrival date & time: 07/31/21  0630     History Chief Complaint  Patient presents with   Chest Pain    Jocelyn Adams is a 50 y.o. female.   Chest Pain  HPI: A 50 year old patient with a history of obesity presents for evaluation of chest pain. Initial onset of pain was more than 6 hours ago. The patient's chest pain is sharp and is not worse with exertion. The patient's chest pain is middle- or left-sided, is not well-localized, is not described as heaviness/pressure/tightness and does radiate to the arms/jaw/neck. The patient does not complain of nausea and denies diaphoresis. The patient has smoked in the past 90 days. The patient has no history of stroke, has no history of peripheral artery disease, denies any history of treated diabetes, has no relevant family history of coronary artery disease (first degree relative at less than age 57), is not hypertensive and has no history of hypercholesterolemia.  Patient states the center of her chest is very tender.  Symptoms started yesterday have been constant since then.  No recent injuries.  No fevers chills or coughing.  Pain does increase with deep breathing. History reviewed. No pertinent past medical history.  There are no problems to display for this patient.   Past Surgical History:  Procedure Laterality Date   ABDOMINAL HYSTERECTOMY     UTERINE FIBROID SURGERY       OB History   No obstetric history on file.     History reviewed. No pertinent family history.  Social History   Tobacco Use   Smoking status: Every Day    Packs/day: 0.50    Types: Cigarettes   Smokeless tobacco: Never  Vaping Use   Vaping Use: Never used  Substance Use Topics   Alcohol use: No   Drug use: No    Home Medications Prior to Admission medications   Medication Sig Start Date End Date Taking? Authorizing Provider  naproxen (NAPROSYN) 375 MG tablet Take 1  tablet (375 mg total) by mouth 2 (two) times daily with a meal for 10 days. 07/31/21 08/10/21 Yes Linwood Dibbles, MD    Allergies    Sulfamethoxazole-trimethoprim  Review of Systems   Review of Systems  Cardiovascular:  Positive for chest pain.  All other systems reviewed and are negative.  Physical Exam Updated Vital Signs BP 125/83   Pulse 64   Temp 98.6 F (37 C) (Oral)   Resp 17   Ht 1.549 m (5\' 1" )   Wt 76 kg   SpO2 100%   BMI 31.67 kg/m   Physical Exam Vitals and nursing note reviewed.  Constitutional:      Appearance: She is well-developed. She is not diaphoretic.  HENT:     Head: Normocephalic and atraumatic.     Right Ear: External ear normal.     Left Ear: External ear normal.  Eyes:     General: No scleral icterus.       Right eye: No discharge.        Left eye: No discharge.     Conjunctiva/sclera: Conjunctivae normal.  Neck:     Trachea: No tracheal deviation.  Cardiovascular:     Rate and Rhythm: Normal rate and regular rhythm.  Pulmonary:     Effort: Pulmonary effort is normal. No respiratory distress.     Breath sounds: Normal breath sounds. No stridor. No wheezing or rales.  Chest:  Chest wall: Tenderness present.  Abdominal:     General: Bowel sounds are normal. There is no distension.     Palpations: Abdomen is soft.     Tenderness: There is no abdominal tenderness. There is no guarding or rebound.  Musculoskeletal:        General: No tenderness or deformity.     Cervical back: Neck supple.  Skin:    General: Skin is warm and dry.     Findings: No rash.  Neurological:     General: No focal deficit present.     Mental Status: She is alert.     Cranial Nerves: No cranial nerve deficit (no facial droop, extraocular movements intact, no slurred speech).     Sensory: No sensory deficit.     Motor: No abnormal muscle tone or seizure activity.     Coordination: Coordination normal.  Psychiatric:        Mood and Affect: Mood normal.    ED  Results / Procedures / Treatments   Labs (all labs ordered are listed, but only abnormal results are displayed) Labs Reviewed  HEPATIC FUNCTION PANEL - Abnormal; Notable for the following components:      Result Value   AST 14 (*)    All other components within normal limits  BASIC METABOLIC PANEL  CBC  LIPASE, BLOOD  D-DIMER, QUANTITATIVE  PREGNANCY, URINE  TROPONIN I (HIGH SENSITIVITY)    EKG EKG Interpretation  Date/Time:  Friday July 31 2021 06:43:07 EDT Ventricular Rate:  78 PR Interval:  135 QRS Duration: 86 QT Interval:  379 QTC Calculation: 432 R Axis:   -14 Text Interpretation: Sinus rhythm ST elev, probable normal early repol pattern No significant change since last tracing Confirmed by Linwood Dibbles [phone]) on 07/31/2021 7:51:11 AM  Radiology DG Chest 2 View  Result Date: 07/31/2021 CLINICAL DATA:  50 year old female with history of chest pain. EXAM: CHEST - 2 VIEW COMPARISON:  Chest x-ray 09/07/2020. FINDINGS: Lung volumes are normal. No consolidative airspace disease. No pleural effusions. No pneumothorax. No pulmonary nodule or mass noted. Pulmonary vasculature and the cardiomediastinal silhouette are within normal limits. IMPRESSION: No radiographic evidence of acute cardiopulmonary disease. Electronically Signed   By: Trudie Reed M.D.   On: 07/31/2021 07:27    Procedures Procedures   Medications Ordered in ED Medications  morphine 4 MG/ML injection 4 mg (4 mg Intravenous Given 07/31/21 0731)  ondansetron (ZOFRAN) injection 4 mg (4 mg Intravenous Given 07/31/21 0731)    ED Course  I have reviewed the triage vital signs and the nursing notes.  Pertinent labs & imaging results that were available during my care of the patient were reviewed by me and considered in my medical decision making (see chart for details).  Clinical Course as of 07/31/21 0851  Fri Jul 31, 2021  0750 CBC and metabolic panel are normal.  First troponin normal [JK]  0750 Chest  x-ray without acute findings [JK]    Clinical Course User Index [JK] Linwood Dibbles, MD   MDM Rules/Calculators/A&P HEAR Score: 3                         Patient presented with complaints of chest pain.  Evaluated for the possibility of acute coronary syndrome.  EKG and troponins are normal.  Symptoms ongoing for greater than 24 hours.  I doubt ACS.  EKG findings are not suggestive of pericarditis.  Low risk for PE, did dimer negative.  I doubt  pulmonary embolism.  No signs of hepatitis or pancreatitis based on laboratory tests.  No pneumothorax or pneumonia based on x-ray findings.  Patient does have reproducible chest wall tenderness.  Suspect costochondritis.  Will Rx NSAIDs.  Discussed outpatient follow-up if symptoms do not resolve.  Warning signs precautions discussed Final Clinical Impression(s) / ED Diagnoses Final diagnoses:  Costochondritis    Rx / DC Orders ED Discharge Orders          Ordered    naproxen (NAPROSYN) 375 MG tablet  2 times daily with meals        07/31/21 0849             Linwood Dibbles, MD 07/31/21 [phone]

## 2021-10-23 ENCOUNTER — Other Ambulatory Visit: Payer: Self-pay

## 2021-10-23 ENCOUNTER — Emergency Department (HOSPITAL_BASED_OUTPATIENT_CLINIC_OR_DEPARTMENT_OTHER)
Admission: EM | Admit: 2021-10-23 | Discharge: 2021-10-23 | Disposition: A | Discharge status: Home / Self Care | Payer: BLUE CROSS/BLUE SHIELD | Attending: Emergency Medicine | Admitting: Emergency Medicine

## 2021-10-23 ENCOUNTER — Other Ambulatory Visit (HOSPITAL_BASED_OUTPATIENT_CLINIC_OR_DEPARTMENT_OTHER): Payer: Self-pay

## 2021-10-23 DIAGNOSIS — J019 Acute sinusitis, unspecified: Secondary | ICD-10-CM | POA: Diagnosis not present

## 2021-10-23 DIAGNOSIS — Z20822 Contact with and (suspected) exposure to covid-19: Secondary | ICD-10-CM | POA: Insufficient documentation

## 2021-10-23 DIAGNOSIS — R0981 Nasal congestion: Secondary | ICD-10-CM | POA: Diagnosis present

## 2021-10-23 LAB — RESP PANEL BY RT-PCR (FLU A&B, COVID) ARPGX2
Influenza A by PCR: NEGATIVE
Influenza B by PCR: NEGATIVE
SARS Coronavirus 2 by RT PCR: NEGATIVE

## 2021-10-23 MED ORDER — AMOXICILLIN 500 MG PO CAPS
500.0000 mg | ORAL_CAPSULE | Freq: Three times a day (TID) | ORAL | 0 refills | Status: AC
  Filled 2021-10-23: qty 21, 7d supply, fill #0

## 2021-10-23 NOTE — ED Notes (Signed)
Pt requested work note and printout of covid results. Information provided as per pt request

## 2021-10-23 NOTE — Discharge Instructions (Signed)
Please drink plenty of fluids.  Flonase which is over-the-counter will help with the nasal congestion.  We are putting you on antibiotics for possible sinus infection.  Please follow-up with your primary care doctor.

## 2021-10-23 NOTE — ED Notes (Signed)
Pt discharged to home. Discharge instructions have been discussed with patient and/or family members. Pt verbally acknowledges understanding d/c instructions, and endorses comprehension to checkout at registration before leaving.  °

## 2021-10-23 NOTE — ED Triage Notes (Signed)
Pt arrives  pov with c/o nasal congestion, x 2 weeks, denies fever. Denies shob

## 2021-10-23 NOTE — ED Provider Notes (Signed)
[facility] EMERGENCY DEPARTMENT Provider Note   CSN: [id number] Arrival date & time: 10/23/21  0932     History  Chief Complaint  Patient presents with   Nasal Congestion    Jocelyn Adams is a 51 y.o. female.  She is here with a complaint of 2 weeks of nasal congestion and sinus pain.  No fevers.  No ear pain or sore throat or cough.  She has tried some Afrin and some Benadryl without any improvement.  The history is provided by the patient.  URI Presenting symptoms: congestion, facial pain and rhinorrhea   Presenting symptoms: no cough, no ear pain, no fever and no sore throat   Severity:  Severe Onset quality:  Gradual Duration:  2 weeks Timing:  Constant Progression:  Unchanged Chronicity:  New Relieved by:  Nothing Worsened by:  Breathing Ineffective treatments:  Decongestant Associated symptoms: sinus pain   Associated symptoms: no neck pain and no wheezing   Risk factors: sick contacts       Home Medications Prior to Admission medications   Not on File      Allergies    Sulfamethoxazole-trimethoprim    Review of Systems   Review of Systems  Constitutional:  Negative for fever.  HENT:  Positive for congestion, rhinorrhea and sinus pain. Negative for ear pain and sore throat.   Eyes:  Negative for visual disturbance.  Respiratory:  Negative for cough and wheezing.   Cardiovascular:  Negative for chest pain.  Musculoskeletal:  Negative for neck pain.  Skin:  Negative for rash.   Physical Exam Updated Vital Signs BP 128/87    Pulse 84    Temp 98.2 F (36.8 C) (Oral)    Resp 18    Ht 5\' 1"  (1.549 m)    Wt 72.6 kg    SpO2 99%    BMI 30.23 kg/m  Physical Exam Constitutional:      Appearance: Normal appearance. She is well-developed.  HENT:     Head: Normocephalic and atraumatic.     Nose: Congestion and rhinorrhea present.     Right Sinus: Maxillary sinus tenderness and frontal sinus tenderness present.     Left Sinus: Maxillary sinus  tenderness and frontal sinus tenderness present.     Mouth/Throat:     Mouth: Mucous membranes are moist.     Pharynx: Oropharynx is clear. No oropharyngeal exudate or posterior oropharyngeal erythema.  Eyes:     Conjunctiva/sclera: Conjunctivae normal.  Cardiovascular:     Rate and Rhythm: Normal rate and regular rhythm.  Pulmonary:     Effort: Pulmonary effort is normal.     Breath sounds: Normal breath sounds.  Musculoskeletal:     Cervical back: Neck supple.  Skin:    General: Skin is warm and dry.  Neurological:     General: No focal deficit present.     Mental Status: She is alert.     GCS: GCS eye subscore is 4. GCS verbal subscore is 5. GCS motor subscore is 6.    ED Results / Procedures / Treatments   Labs (all labs ordered are listed, but only abnormal results are displayed) Labs Reviewed  RESP PANEL BY RT-PCR (FLU A&B, COVID) [id number]    EKG None  Radiology No results found.  Procedures Procedures    Medications Ordered in ED Medications - No data to display  ED Course/ Medical Decision Making/ A&P  Medical Decision Making Risk Prescription drug management.  Jocelyn Adams was evaluated in Emergency Department on 10/23/2021 for the symptoms described in the history of present illness. She was evaluated in the context of the global COVID-19 pandemic, which necessitated consideration that the patient might be at risk for infection with the SARS-CoV-2 virus that causes COVID-19. Institutional protocols and algorithms that pertain to the evaluation of patients at risk for COVID-19 are in a state of rapid change based on information released by regulatory bodies including the CDC and federal and state organizations. These policies and algorithms were followed during the patient's care in the ED. 51 year old female here with head congestion and nasal congestion.  Clinically has viral URI.  COVID and flu negative.  Patient demanding  antibiotics as feels has bacterial infection.  Try to reassure patient that she should do symptomatic treatment with Flonase and over-the-counter medications.  Ultimately prescribed antibiotics due to patient's insistence.  Return instructions discussed         Final Clinical Impression(s) / ED Diagnoses Final diagnoses:  Nasal congestion  Acute non-recurrent sinusitis, unspecified location    Rx / DC Orders ED Discharge Orders          Ordered    amoxicillin (AMOXIL) 500 MG capsule  3 times daily        10/23/21 1100              Terrilee Files, MD 10/23/21 1921

## 2022-09-23 ENCOUNTER — Encounter (HOSPITAL_BASED_OUTPATIENT_CLINIC_OR_DEPARTMENT_OTHER): Payer: Self-pay | Admitting: Emergency Medicine

## 2022-09-23 ENCOUNTER — Other Ambulatory Visit (HOSPITAL_BASED_OUTPATIENT_CLINIC_OR_DEPARTMENT_OTHER): Payer: Self-pay

## 2022-09-23 ENCOUNTER — Other Ambulatory Visit: Payer: Self-pay

## 2022-09-23 ENCOUNTER — Emergency Department (HOSPITAL_BASED_OUTPATIENT_CLINIC_OR_DEPARTMENT_OTHER)
Admission: EM | Admit: 2022-09-23 | Discharge: 2022-09-23 | Disposition: A | Discharge status: Home / Self Care | Payer: BLUE CROSS/BLUE SHIELD | Attending: Emergency Medicine | Admitting: Emergency Medicine

## 2022-09-23 DIAGNOSIS — B9689 Other specified bacterial agents as the cause of diseases classified elsewhere: Secondary | ICD-10-CM

## 2022-09-23 DIAGNOSIS — N760 Acute vaginitis: Secondary | ICD-10-CM | POA: Insufficient documentation

## 2022-09-23 LAB — WET PREP, GENITAL
Sperm: NONE SEEN
Trich, Wet Prep: NONE SEEN
WBC, Wet Prep HPF POC: <10 (ref <10)
Yeast Wet Prep HPF POC: NONE SEEN

## 2022-09-23 MED ORDER — METRONIDAZOLE 500 MG PO TABS
500.0000 mg | ORAL_TABLET | Freq: Two times a day (BID) | ORAL | 0 refills | Status: AC
  Filled 2022-09-23: qty 14, 7d supply, fill #0

## 2022-09-23 NOTE — ED Triage Notes (Signed)
Pt states she has std.  Some vaginal discharge.  No fever

## 2022-09-23 NOTE — ED Provider Notes (Signed)
[facility] EMERGENCY DEPARTMENT Provider Note   CSN: [id number] Arrival date & time: 09/23/22  0908     History  Chief Complaint  Patient presents with   Vaginal Discharge    Jocelyn Adams is a 51 y.o. female.  Pt reports her partner has had trichomonas   The history is provided by the patient. No language interpreter was used.  Vaginal Discharge Quality:  Manson Passey Severity:  Moderate Onset quality:  Gradual Duration:  3 days Timing:  Constant Progression:  Worsening Context: not recent antibiotic use   Relieved by:  Nothing Worsened by:  Nothing Ineffective treatments:  None tried Associated symptoms: no abdominal pain   Risk factors: STI        Home Medications Prior to Admission medications   Medication Sig Start Date End Date Taking? Authorizing Provider  amoxicillin (AMOXIL) 500 MG capsule Take 1 capsule (500 mg total) by mouth 3 (three) times daily. 10/23/21   Terrilee Files, MD      Allergies    Sulfamethoxazole-trimethoprim    Review of Systems   Review of Systems  Gastrointestinal:  Negative for abdominal pain.  Genitourinary:  Positive for vaginal discharge.    Physical Exam Updated Vital Signs BP 129/85   Pulse 77   Temp 98.5 F (36.9 C) (Oral)   Ht 5\' 1"  (1.549 m)   Wt 77.1 kg   SpO2 100%   BMI 32.12 kg/m  Physical Exam Vitals and nursing note reviewed.  Constitutional:      Appearance: She is well-developed.  HENT:     Head: Normocephalic.  Cardiovascular:     Rate and Rhythm: Normal rate.  Pulmonary:     Effort: Pulmonary effort is normal.  Abdominal:     General: There is no distension.  Musculoskeletal:        General: Normal range of motion.     Cervical back: Normal range of motion.  Neurological:     Mental Status: She is alert and oriented to person, place, and time.  Psychiatric:        Mood and Affect: Mood normal.     ED Results / Procedures / Treatments   Labs (all labs ordered are listed, but  only abnormal results are displayed) Labs Reviewed  WET PREP, GENITAL - Abnormal; Notable for the following components:      Result Value   Clue Cells Wet Prep HPF POC PRESENT (*)    All other components within normal limits  GC/CHLAMYDIA PROBE AMP ([place]) NOT AT [facility]    EKG None  Radiology No results found.  Procedures Procedures    Medications Ordered in ED Medications - No data to display  ED Course/ Medical Decision Making/ A&P                           Medical Decision Making Pt complains of possible std exposure   Amount and/or Complexity of Data Reviewed Labs: ordered. Decision-making details documented in ED Course.    Details: Wet prep shows clue cells            Final Clinical Impression(s) / ED Diagnoses Final diagnoses:  BV (bacterial vaginosis)    Rx / DC Orders ED Discharge Orders     None      An After Visit Summary was printed and given to the patient.    [facility], [place] 09/23/22 1116    09/25/22, MD 09/23/22 1309

## 2022-09-23 NOTE — Discharge Instructions (Signed)
Return if any problems.

## 2022-09-24 LAB — GC/CHLAMYDIA PROBE AMP (~~LOC~~) NOT AT ARMC
Chlamydia: NEGATIVE
Comment: NEGATIVE
Comment: NORMAL
Neisseria Gonorrhea: NEGATIVE

## 2023-05-26 ENCOUNTER — Emergency Department (HOSPITAL_BASED_OUTPATIENT_CLINIC_OR_DEPARTMENT_OTHER)
Admission: EM | Admit: 2023-05-26 | Discharge: 2023-05-26 | Disposition: A | Discharge status: Home / Self Care | Payer: MEDICAID | Attending: Emergency Medicine | Admitting: Emergency Medicine

## 2023-05-26 ENCOUNTER — Encounter (HOSPITAL_BASED_OUTPATIENT_CLINIC_OR_DEPARTMENT_OTHER): Payer: Self-pay | Admitting: Urology

## 2023-05-26 ENCOUNTER — Emergency Department (HOSPITAL_BASED_OUTPATIENT_CLINIC_OR_DEPARTMENT_OTHER): Payer: MEDICAID

## 2023-05-26 ENCOUNTER — Other Ambulatory Visit: Payer: Self-pay

## 2023-05-26 DIAGNOSIS — M654 Radial styloid tenosynovitis [de Quervain]: Secondary | ICD-10-CM | POA: Diagnosis not present

## 2023-05-26 DIAGNOSIS — M25531 Pain in right wrist: Secondary | ICD-10-CM | POA: Diagnosis present

## 2023-05-26 NOTE — Discharge Instructions (Signed)
You were seen in the ER for an insect bite. There does not appear to be any signs of infection and the bite has healed, but I believe that you likely have a condition called De Quervain tenosynovitis which is inflammation of the tendon sheath that goes into the thumb. I have placed you into a wrist brace for added support. Take antiinflammatories such as ibuprofen or naproxen for the next 2 weeks as well to manage this pain and swelling.

## 2023-05-26 NOTE — ED Provider Notes (Signed)
[place] EMERGENCY DEPARTMENT AT [facility] Provider Note   CSN: [id number] Arrival date & time: 05/26/23  1808     History Chief Complaint  Patient presents with   Insect Bite    Jocelyn Adams is a 52 y.o. female. Patient presents with concerns of an insect bite. Reports that this occurred about 1 month ago but is unsure what exactly bit her. Believe it could have been a spider. Has been having lingering pain since then. 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 Has not taken anything for pain up to this poin. No fevers, injury, or recent trauma to the area.  HPI     Home Medications Prior to Admission medications   Medication Sig Start Date End Date Taking? Authorizing Provider  amoxicillin (AMOXIL) 500 MG capsule Take 1 capsule (500 mg total) by mouth 3 (three) times daily. 10/23/21   Terrilee Files, MD      Allergies    Sulfamethoxazole-trimethoprim    Review of Systems   Review of Systems  Musculoskeletal:  Positive for joint swelling.  All other systems reviewed and are negative.   Physical Exam Updated Vital Signs BP (!) 159/89 (BP Location: Right Arm)   Pulse 66   Temp 98.2 F (36.8 C)   Resp 17   Ht 5\' 1"  (1.549 m)   Wt 77.1 kg   SpO2 99%   BMI 32.12 kg/m  Physical Exam Vitals and nursing note reviewed.  Constitutional:      General: She is not in acute distress.    Appearance: She is well-developed.  HENT:     Head: Normocephalic and atraumatic.  Eyes:     General: No scleral icterus.       Right eye: No discharge.        Left eye: No discharge.     Conjunctiva/sclera: Conjunctivae normal.  Cardiovascular:     Rate and Rhythm: Normal rate and regular rhythm.     Heart sounds: No murmur heard. Pulmonary:     Effort: Pulmonary effort is normal. No respiratory distress.     Breath sounds: Normal breath sounds.  Abdominal:     Palpations: Abdomen is soft.      Tenderness: There is no abdominal tenderness.  Musculoskeletal:        General: Tenderness present. No swelling, deformity or signs of injury. Normal range of motion.       Arms:     Cervical back: Neck supple.     Comments: TTP over the base of the right thumb. Minimal swelling and no erythema present. Positive Finklestein test.  Skin:    General: Skin is warm and dry.     Capillary Refill: Capillary refill takes less than 2 seconds.     Findings: No rash.  Neurological:     Mental Status: She is alert.  Psychiatric:        Mood and Affect: Mood normal.     ED Results / Procedures / Treatments   Labs (all labs ordered are listed, but only abnormal results are displayed) Labs Reviewed - No data to display  EKG None  Radiology DG Wrist Complete Right  Result Date: 05/26/2023 CLINICAL DATA:  Pain, insect bite EXAM: RIGHT WRIST - COMPLETE 3+ VIEW COMPARISON:  None Available. FINDINGS: There is no evidence of fracture or dislocation. There is no evidence of arthropathy or other focal bone abnormality. Soft tissues are unremarkable. IMPRESSION:  Negative. Electronically Signed   By: Charlett Nose M.D.   On: 05/26/2023 20:16    Procedures Procedures   Medications Ordered in ED Medications - No data to display  ED Course/ Medical Decision Making/ A&P                               Medical Decision Making Amount and/or Complexity of Data Reviewed Radiology: ordered.   This patient presents to the ED for concern of insect bite. Differential diagnosis includes spider bite, flexor tenosynovitis, De Quervain tenosynovitis, finger fracture, dislocation   Imaging Studies ordered:  I ordered imaging studies including x-ray right wrist I independently visualized and interpreted imaging which showed no evidence of any fractures, dislocations, or soft tissue swelling I agree with the radiologist interpretation   Problem List / ED Course:  Patient presented to the emergency  department concerns of a right wrist pain and reports this happened on 7/26 following a spider bite.  States that she had an episode of improvement over several weeks but has now worsened once again.  Concerned about mild swelling over the base of the right thumb but is slow to move without significant difficulty.  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 On my assessment, skin does not appear to be erythematous and no signs of infection at this time.  Only mild swelling is noted and patient has a positive Finkelstein test.  Will evaluate with x-ray imaging to rule out any other concerning cause of current pain. X-ray negative.  Appears likely the patient does have de Quervain's tenosynovitis given positive Finkelstein test and in the setting of repetitive motions with patient's line of work.  Will place patient into a thumb spica and advised anti-inflammatories over the next 2 weeks.  Encourage primary care follow-up with potential need for rheumatology evaluation if symptoms not improving. Patient is agreeable with treatment plan and verbalized understanding all return precautions. All questions answered prior to patient discharge.   Final Clinical Impression(s) / ED Diagnoses Final diagnoses:  Tenosynovitis, de Quervain    Rx / DC Orders ED Discharge Orders     None         Salomon Mast 05/26/23 2316    Alvira Monday, MD 05/27/23 [phone]

## 2023-05-26 NOTE — ED Triage Notes (Signed)
Insect bite to right hand on 7/26  States continued pain to hand, no redness noted, slight swelling

## 2023-10-22 ENCOUNTER — Encounter (HOSPITAL_BASED_OUTPATIENT_CLINIC_OR_DEPARTMENT_OTHER): Payer: Self-pay | Admitting: Emergency Medicine

## 2023-10-22 ENCOUNTER — Emergency Department (HOSPITAL_BASED_OUTPATIENT_CLINIC_OR_DEPARTMENT_OTHER)
Admission: EM | Admit: 2023-10-22 | Discharge: 2023-10-22 | Disposition: A | Discharge status: Home / Self Care | Payer: MEDICAID | Attending: Emergency Medicine | Admitting: Emergency Medicine

## 2023-10-22 ENCOUNTER — Other Ambulatory Visit: Payer: Self-pay

## 2023-10-22 ENCOUNTER — Emergency Department (HOSPITAL_BASED_OUTPATIENT_CLINIC_OR_DEPARTMENT_OTHER): Payer: MEDICAID

## 2023-10-22 DIAGNOSIS — J069 Acute upper respiratory infection, unspecified: Secondary | ICD-10-CM | POA: Insufficient documentation

## 2023-10-22 DIAGNOSIS — R059 Cough, unspecified: Secondary | ICD-10-CM | POA: Diagnosis present

## 2023-10-22 DIAGNOSIS — Z1152 Encounter for screening for COVID-19: Secondary | ICD-10-CM | POA: Insufficient documentation

## 2023-10-22 LAB — RESP PANEL BY RT-PCR (RSV, FLU A&B, COVID)  RVPGX2
Influenza A by PCR: NEGATIVE
Influenza B by PCR: NEGATIVE
Resp Syncytial Virus by PCR: NEGATIVE
SARS Coronavirus 2 by RT PCR: NEGATIVE

## 2023-10-22 MED ORDER — ALBUTEROL SULFATE HFA 108 (90 BASE) MCG/ACT IN AERS
2.0000 | INHALATION_SPRAY | Freq: Once | RESPIRATORY_TRACT | Status: AC
  Administered 2023-10-22: 2 via RESPIRATORY_TRACT
  Filled 2023-10-22: qty 6.7

## 2023-10-22 MED ORDER — AZITHROMYCIN 250 MG PO TABS
250.0000 mg | ORAL_TABLET | Freq: Every day | ORAL | 0 refills | Status: DC
  Filled 2023-10-22: qty 6, 6d supply, fill #0

## 2023-10-22 MED ORDER — AZITHROMYCIN 250 MG PO TABS: 250.0000 mg | ORAL_TABLET | Freq: Every day | ORAL | 0 refills | Status: AC

## 2023-10-22 MED ORDER — BENZONATATE 100 MG PO CAPS
100.0000 mg | ORAL_CAPSULE | Freq: Three times a day (TID) | ORAL | 0 refills | Status: DC
  Filled 2023-10-22: qty 21, 7d supply, fill #0

## 2023-10-22 MED ORDER — ONDANSETRON 4 MG PO TBDP
ORAL_TABLET | ORAL | 0 refills | Status: DC
  Filled 2023-10-22: qty 20, fill #0

## 2023-10-22 MED ORDER — DEXAMETHASONE 4 MG PO TABS
10.0000 mg | ORAL_TABLET | Freq: Once | ORAL | Status: AC
  Administered 2023-10-22: 10 mg via ORAL
  Filled 2023-10-22: qty 3

## 2023-10-22 MED ORDER — BENZONATATE 100 MG PO CAPS: 100.0000 mg | ORAL_CAPSULE | Freq: Three times a day (TID) | ORAL | 0 refills | Status: AC

## 2023-10-22 MED ORDER — ONDANSETRON 4 MG PO TBDP: ORAL_TABLET | ORAL | 0 refills | Status: AC

## 2023-10-22 NOTE — Discharge Instructions (Signed)
Take tylenol 2 pills 4 times a day and motrin 4 pills 3 times a day.  Drink plenty of fluids.  Return for worsening shortness of breath, headache, confusion. Follow up with your family doctor.   

## 2023-10-22 NOTE — ED Triage Notes (Signed)
Pt with productive cough x 4 wks

## 2023-10-22 NOTE — ED Provider Notes (Signed)
[place] EMERGENCY DEPARTMENT AT [facility] Provider Note   CSN: [id number] Arrival date & time: 10/22/23  1604     History  Chief Complaint  Patient presents with   Cough    Jocelyn Adams is a 53 y.o. female.  53 yo F with a chief complaints of a ongoing cough.  The patient said she got sick about New Year's Eve and since then has still been sick.  She states going on for four weeks.  Patient was encouraged by her mother to come in to be evaluated.  She does feel like she has been coughing up different colored sputum than she was before.  She denies history of smoking or vaping.  Denies history of asthma.   Cough      Home Medications Prior to Admission medications   Medication Sig Start Date End Date Taking? Authorizing Provider  azithromycin (ZITHROMAX) 250 MG tablet Take 1 tablet (250 mg total) by mouth daily. Take first 2 tablets together, then 1 every day until finished. 10/22/23  Yes Melene Plan, DO  benzonatate (TESSALON) 100 MG capsule Take 1 capsule (100 mg total) by mouth every 8 (eight) hours. 10/22/23  Yes Melene Plan, DO  ondansetron (ZOFRAN-ODT) 4 MG disintegrating tablet 4mg  ODT q4 hours prn nausea/vomit 10/22/23  Yes Melene Plan, DO  amoxicillin (AMOXIL) 500 MG capsule Take 1 capsule (500 mg total) by mouth 3 (three) times daily. 10/23/21   Terrilee Files, MD      Allergies    Sulfamethoxazole-trimethoprim    Review of Systems   Review of Systems  Respiratory:  Positive for cough.     Physical Exam Updated Vital Signs BP 117/79   Pulse 87   Temp 98 F (36.7 C)   Resp 18   Ht 5\' 1"  (1.549 m)   Wt 77.1 kg   SpO2 97%   BMI 32.12 kg/m  Physical Exam Vitals and nursing note reviewed.  Constitutional:      General: She is not in acute distress.    Appearance: She is well-developed. She is not diaphoretic.  HENT:     Head: Normocephalic and atraumatic.     Comments: Swollen turbinates, posterior nasal drip, no noted sinus ttp, tm  normal bilaterally.   Eyes:     Pupils: Pupils are equal, round, and reactive to light.  Cardiovascular:     Rate and Rhythm: Normal rate and regular rhythm.     Heart sounds: No murmur heard.    No friction rub. No gallop.  Pulmonary:     Effort: Pulmonary effort is normal.     Breath sounds: No wheezing or rales.  Abdominal:     General: There is no distension.     Palpations: Abdomen is soft.     Tenderness: There is no abdominal tenderness.  Musculoskeletal:        General: No tenderness.     Cervical back: Normal range of motion and neck supple.  Skin:    General: Skin is warm and dry.  Neurological:     Mental Status: She is alert and oriented to person, place, and time.  Psychiatric:        Behavior: Behavior normal.     ED Results / Procedures / Treatments   Labs (all labs ordered are listed, but only abnormal results are displayed) Labs Reviewed  RESP PANEL BY RT-PCR (RSV, FLU A&B, COVID)  [id number]    EKG None  Radiology DG Chest 2 View Result  Date: 10/22/2023 CLINICAL DATA:  Cough for 4 weeks. EXAM: CHEST - 2 VIEW COMPARISON:  07/31/2021 FINDINGS: Heart size is normal. No pleural effusion or edema. No airspace opacities identified. Visualized osseous structures are unremarkable. IMPRESSION: No active cardiopulmonary disease. Electronically Signed   By: Signa Kell M.D.   On: 10/22/2023 16:50    Procedures Procedures    Medications Ordered in ED Medications  dexamethasone (DECADRON) tablet 10 mg (has no administration in time range)  albuterol (VENTOLIN HFA) 108 (90 Base) MCG/ACT inhaler 2 puff (has no administration in time range)    ED Course/ Medical Decision Making/ A&P                                 Medical Decision Making Amount and/or Complexity of Data Reviewed Radiology: ordered.  Risk Prescription drug management.   53 yo F with a chief complaints of cough and congestion this been going on for 4 weeks.  She is well-appearing  nontoxic.  Clear lung sounds for me.  COVID flu and RSV negative.  Chest x-ray independently interpreted by me without focal infiltrate or pneumothorax.  Patient would like a trial of antibiotics after shared decision making at bedside.  Will also trial albuterol therapy.  PCP follow-up.  6:14 PM:  I have discussed the diagnosis/risks/treatment options with the patient.  Evaluation and diagnostic testing in the emergency department does not suggest an emergent condition requiring admission or immediate intervention beyond what has been performed at this time.  They will follow up with PCP. We also discussed returning to the ED immediately if new or worsening sx occur. We discussed the sx which are most concerning (e.g., sudden worsening pain, fever, inability to tolerate by mouth) that necessitate immediate return. Medications administered to the patient during their visit and any new prescriptions provided to the patient are listed below.  Medications given during this visit Medications  dexamethasone (DECADRON) tablet 10 mg (has no administration in time range)  albuterol (VENTOLIN HFA) 108 (90 Base) MCG/ACT inhaler 2 puff (has no administration in time range)     The patient appears reasonably screen and/or stabilized for discharge and I doubt any other medical condition or other [facility] requiring further screening, evaluation, or treatment in the ED at this time prior to discharge.          Final Clinical Impression(s) / ED Diagnoses Final diagnoses:  Viral URI with cough    Rx / DC Orders ED Discharge Orders          Ordered    azithromycin (ZITHROMAX) 250 MG tablet  Daily        10/22/23 1811    benzonatate (TESSALON) 100 MG capsule  Every 8 hours        10/22/23 1811    ondansetron (ZOFRAN-ODT) 4 MG disintegrating tablet        10/22/23 1811              Melene Plan, DO 10/22/23 1814

## 2023-10-23 ENCOUNTER — Other Ambulatory Visit: Payer: Self-pay

## 2023-10-24 ENCOUNTER — Other Ambulatory Visit (HOSPITAL_BASED_OUTPATIENT_CLINIC_OR_DEPARTMENT_OTHER): Payer: Self-pay

## 2024-04-29 ENCOUNTER — Encounter (HOSPITAL_BASED_OUTPATIENT_CLINIC_OR_DEPARTMENT_OTHER): Payer: Self-pay

## 2024-04-29 ENCOUNTER — Other Ambulatory Visit: Payer: Self-pay

## 2024-04-29 ENCOUNTER — Emergency Department (HOSPITAL_BASED_OUTPATIENT_CLINIC_OR_DEPARTMENT_OTHER): Payer: MEDICAID

## 2024-04-29 ENCOUNTER — Emergency Department (HOSPITAL_BASED_OUTPATIENT_CLINIC_OR_DEPARTMENT_OTHER)
Admission: EM | Admit: 2024-04-29 | Discharge: 2024-04-29 | Disposition: A | Discharge status: Home / Self Care | Payer: MEDICAID | Attending: Emergency Medicine | Admitting: Emergency Medicine

## 2024-04-29 DIAGNOSIS — M25561 Pain in right knee: Secondary | ICD-10-CM | POA: Diagnosis present

## 2024-04-29 DIAGNOSIS — M25562 Pain in left knee: Secondary | ICD-10-CM | POA: Diagnosis not present

## 2024-04-29 DIAGNOSIS — W06XXXA Fall from bed, initial encounter: Secondary | ICD-10-CM | POA: Diagnosis not present

## 2024-04-29 MED ORDER — METHYLPREDNISOLONE 4 MG PO TBPK: ORAL_TABLET | ORAL | 0 refills | Status: AC

## 2024-04-29 MED ORDER — KETOROLAC TROMETHAMINE 60 MG/2ML IM SOLN
60.0000 mg | Freq: Once | INTRAMUSCULAR | Status: AC
  Administered 2024-04-29: 60 mg via INTRAMUSCULAR
  Filled 2024-04-29: qty 2

## 2024-04-29 NOTE — ED Triage Notes (Signed)
 Pt reports that she slipped and fell from her bed X 7 days. Reports bilateral knee pain since. Pain hasn't worsened but isn't getting any better.

## 2024-04-29 NOTE — ED Provider Notes (Signed)
 [place] EMERGENCY DEPARTMENT AT [facility] Provider Note   CSN: [id number] Arrival date & time: 04/29/24  9161     Patient presents with: Knee Pain   Jocelyn Adams is a 53 y.o. female.   Patient here with bilateral knee pain after a fall out of her bed 7 days ago.  She has been having bilateral knee pain since.  She has been ambulatory but with discomfort.  Pain has not gotten any better despite pain medication at home.  She denies any weakness numbness tingling.  Has not used any assistive devices.  Denies any prior injuries to this area.  Denies any pain elsewhere.  The history is provided by the patient.       Prior to Admission medications   Medication Sig Start Date End Date Taking? Authorizing Provider  methylPREDNISolone  (MEDROL  DOSEPAK) 4 MG TBPK tablet Follow package insert 04/29/24  Yes Ayriel Texidor, DO  amoxicillin  (AMOXIL ) 500 MG capsule Take 1 capsule (500 mg total) by mouth 3 (three) times daily. 10/23/21   Towana Ozell BROCKS, MD  azithromycin  (ZITHROMAX ) 250 MG tablet Take 1 tablet (250 mg total) by mouth daily. Take first 2 tablets together, then 1 every day until finished. 10/22/23   Emil Share, DO  benzonatate  (TESSALON ) 100 MG capsule Take 1 capsule (100 mg total) by mouth every 8 (eight) hours. 10/22/23   Emil Share, DO  ondansetron  (ZOFRAN -ODT) 4 MG disintegrating tablet 4mg  ODT q4 hours prn nausea/vomit 10/22/23   Floyd, Dan, DO    Allergies: Sulfamethoxazole-trimethoprim    Review of Systems  Updated Vital Signs BP 128/87 (BP Location: Right Arm)   Pulse 81   Temp 98.8 F (37.1 C) (Oral)   Resp 17   SpO2 99%   Physical Exam Vitals and nursing note reviewed.  Constitutional:      General: She is not in acute distress.    Appearance: She is well-developed. She is not ill-appearing.  HENT:     Head: Normocephalic and atraumatic.  Eyes:     Conjunctiva/sclera: Conjunctivae normal.  Cardiovascular:     Rate and Rhythm: Normal rate  and regular rhythm.     Pulses: Normal pulses.     Heart sounds: No murmur heard. Pulmonary:     Effort: Pulmonary effort is normal. No respiratory distress.     Breath sounds: Normal breath sounds.  Abdominal:     Palpations: Abdomen is soft.     Tenderness: There is no abdominal tenderness.  Musculoskeletal:        General: Tenderness present. No swelling. Normal range of motion.     Cervical back: Normal range of motion and neck supple.     Comments: Tenderness to both knees but no obvious deformity or swelling  Skin:    General: Skin is warm and dry.     Capillary Refill: Capillary refill takes less than 2 seconds.  Neurological:     General: No focal deficit present.     Mental Status: She is alert.     Sensory: No sensory deficit.     Motor: No weakness.  Psychiatric:        Mood and Affect: Mood normal.     (all labs ordered are listed, but only abnormal results are displayed) Labs Reviewed - No data to display  EKG: None  Radiology: No results found.   Procedures   Medications Ordered in the ED  ketorolac  (TORADOL ) injection 60 mg (60 mg Intramuscular Given 04/29/24 0856)  Medical Decision Making Amount and/or Complexity of Data Reviewed Radiology: ordered.  Risk Prescription drug management.   Jocelyn Adams is here with bilateral knee pain after a fall last week.  Differential diagnosis likely bilateral contusion sprain.  X-rays today show no acute fracture or alignment.  Patient has been ambulatory since this fall but with discomfort.  Will prescribe Medrol  Dosepak.  Placed knees in Ace wrap's and will give walker and have her follow-up with orthopedics.  She is neurovascular neuromuscular intact on exam.  I do not appreciate any major laxities in the knee joints.  She discharged in good condition.  Understands return precautions.  Recommend ice Tylenol .  This chart was dictated using voice recognition software.   Despite best efforts to proofread,  errors can occur which can change the documentation meaning.      Final diagnoses:  Acute pain of both knees    ED Discharge Orders          Ordered    methylPREDNISolone  (MEDROL  DOSEPAK) 4 MG TBPK tablet        04/29/24 0858               Ruthe Cornet, DO 04/29/24 [phone]

## 2024-04-29 NOTE — Discharge Instructions (Signed)
 Recommend ice, Tylenol  1000 mg every 6 hours.  Take Medrol  Dosepak as prescribed.  Use Ace wrap for comfort.  Use walker for comfort.  Follow-up with orthopedics.  X-rays today were unremarkable.  I suspect bilateral knee bruises or sprains.

## 2024-04-29 NOTE — ED Notes (Signed)
 Pt alert and oriented X 4 at the time of discharge. RR even and unlabored. No acute distress noted. Pt verbalized understanding of discharge instructions as discussed. Pt ambulatory to lobby at time of discharge with walker.
